# Patient Record
Sex: Female | Born: 1980 | Race: White | Hispanic: No | Marital: Single | Smoking: Former smoker
Health system: Southern US, Community
[De-identification: ages and names within clinical notes are randomized; demographics above are authoritative.]

## PROBLEM LIST (undated history)

## (undated) DIAGNOSIS — F419 Anxiety disorder, unspecified: Secondary | ICD-10-CM

## (undated) DIAGNOSIS — F32A Depression, unspecified: Secondary | ICD-10-CM

## (undated) DIAGNOSIS — E0500 Thyrotoxicosis with diffuse goiter without thyrotoxic crisis or storm: Secondary | ICD-10-CM

## (undated) DIAGNOSIS — F4310 Post-traumatic stress disorder, unspecified: Secondary | ICD-10-CM

## (undated) DIAGNOSIS — Z8679 Personal history of other diseases of the circulatory system: Secondary | ICD-10-CM

## (undated) DIAGNOSIS — E0590 Thyrotoxicosis, unspecified without thyrotoxic crisis or storm: Secondary | ICD-10-CM

## (undated) HISTORY — DX: Depression, unspecified: F32.A

## (undated) HISTORY — DX: Post-traumatic stress disorder, unspecified: F43.10

## (undated) HISTORY — DX: Thyrotoxicosis with diffuse goiter without thyrotoxic crisis or storm: E05.00

## (undated) HISTORY — DX: Thyrotoxicosis, unspecified without thyrotoxic crisis or storm: E05.90

## (undated) HISTORY — PX: NASAL SINUS SURGERY: SHX719

## (undated) HISTORY — DX: Anxiety disorder, unspecified: F41.9

## (undated) HISTORY — PX: TONSILLECTOMY: SUR1361

## (undated) HISTORY — DX: Personal history of other diseases of the circulatory system: Z86.79

---

## 2019-11-07 ENCOUNTER — Emergency Department (HOSPITAL_BASED_OUTPATIENT_CLINIC_OR_DEPARTMENT_OTHER): Payer: BC Managed Care – PPO

## 2019-11-07 ENCOUNTER — Encounter (HOSPITAL_BASED_OUTPATIENT_CLINIC_OR_DEPARTMENT_OTHER): Payer: Self-pay | Admitting: Emergency Medicine

## 2019-11-07 ENCOUNTER — Telehealth (HOSPITAL_BASED_OUTPATIENT_CLINIC_OR_DEPARTMENT_OTHER): Payer: Self-pay | Admitting: Emergency Medicine

## 2019-11-07 ENCOUNTER — Emergency Department (HOSPITAL_BASED_OUTPATIENT_CLINIC_OR_DEPARTMENT_OTHER)
Admission: EM | Admit: 2019-11-07 | Discharge: 2019-11-07 | Disposition: A | Discharge status: Home / Self Care | Payer: BC Managed Care – PPO | Attending: Emergency Medicine | Admitting: Emergency Medicine

## 2019-11-07 ENCOUNTER — Other Ambulatory Visit: Payer: Self-pay

## 2019-11-07 DIAGNOSIS — N898 Other specified noninflammatory disorders of vagina: Secondary | ICD-10-CM | POA: Diagnosis not present

## 2019-11-07 DIAGNOSIS — R202 Paresthesia of skin: Secondary | ICD-10-CM | POA: Diagnosis not present

## 2019-11-07 DIAGNOSIS — R42 Dizziness and giddiness: Secondary | ICD-10-CM | POA: Diagnosis not present

## 2019-11-07 DIAGNOSIS — R002 Palpitations: Secondary | ICD-10-CM | POA: Diagnosis present

## 2019-11-07 DIAGNOSIS — Z20822 Contact with and (suspected) exposure to covid-19: Secondary | ICD-10-CM | POA: Insufficient documentation

## 2019-11-07 DIAGNOSIS — E039 Hypothyroidism, unspecified: Secondary | ICD-10-CM | POA: Diagnosis not present

## 2019-11-07 LAB — URINALYSIS, MICROSCOPIC (REFLEX)

## 2019-11-07 LAB — CBC WITH DIFFERENTIAL/PLATELET
Abs Immature Granulocytes: 0.07 10*3/uL (ref 0.00–0.07)
Basophils Absolute: 0.1 10*3/uL (ref 0.0–0.1)
Basophils Relative: 0 %
Eosinophils Absolute: 0 10*3/uL (ref 0.0–0.5)
Eosinophils Relative: 0 %
HCT: 42.4 % (ref 36.0–46.0)
Hemoglobin: 13.7 g/dL (ref 12.0–15.0)
Immature Granulocytes: 1 %
Lymphocytes Relative: 10 %
Lymphs Abs: 1.6 10*3/uL (ref 0.7–4.0)
MCH: 24.8 pg — ABNORMAL LOW (ref 26.0–34.0)
MCHC: 32.3 g/dL (ref 30.0–36.0)
MCV: 76.7 fL — ABNORMAL LOW (ref 80.0–100.0)
Monocytes Absolute: 0.7 10*3/uL (ref 0.1–1.0)
Monocytes Relative: 5 %
Neutro Abs: 13.1 10*3/uL — ABNORMAL HIGH (ref 1.7–7.7)
Neutrophils Relative %: 84 %
Platelets: 501 10*3/uL — ABNORMAL HIGH (ref 150–400)
RBC: 5.53 MIL/uL — ABNORMAL HIGH (ref 3.87–5.11)
RDW: 13.7 % (ref 11.5–15.5)
WBC: 15.5 10*3/uL — ABNORMAL HIGH (ref 4.0–10.5)
nRBC: 0 % (ref 0.0–0.2)

## 2019-11-07 LAB — COMPREHENSIVE METABOLIC PANEL
ALT: 15 U/L (ref 0–44)
AST: 24 U/L (ref 15–41)
Albumin: 4.8 g/dL (ref 3.5–5.0)
Alkaline Phosphatase: 94 U/L (ref 38–126)
Anion gap: 13 (ref 5–15)
BUN: 9 mg/dL (ref 6–20)
CO2: 21 mmol/L — ABNORMAL LOW (ref 22–32)
Calcium: 9.3 mg/dL (ref 8.9–10.3)
Chloride: 103 mmol/L (ref 98–111)
Creatinine, Ser: 0.76 mg/dL (ref 0.44–1.00)
GFR calc Af Amer: >60 mL/min (ref >60)
GFR calc non Af Amer: >60 mL/min (ref >60)
Glucose, Bld: 153 mg/dL — ABNORMAL HIGH (ref 70–99)
Potassium: 3.4 mmol/L — ABNORMAL LOW (ref 3.5–5.1)
Sodium: 137 mmol/L (ref 135–145)
Total Bilirubin: 0.4 mg/dL (ref 0.3–1.2)
Total Protein: 8 g/dL (ref 6.5–8.1)

## 2019-11-07 LAB — WET PREP, GENITAL
Clue Cells Wet Prep HPF POC: NONE SEEN
Sperm: NONE SEEN
Trich, Wet Prep: NONE SEEN
Yeast Wet Prep HPF POC: NONE SEEN

## 2019-11-07 LAB — TSH: TSH: <0.01 u[IU]/mL — ABNORMAL LOW (ref 0.350–4.500)

## 2019-11-07 LAB — URINALYSIS, ROUTINE W REFLEX MICROSCOPIC
Bilirubin Urine: NEGATIVE
Glucose, UA: 250 mg/dL — AB
Ketones, ur: NEGATIVE mg/dL
Leukocytes,Ua: NEGATIVE
Nitrite: NEGATIVE
Protein, ur: NEGATIVE mg/dL
Specific Gravity, Urine: 1.025 (ref 1.005–1.030)
pH: 6 (ref 5.0–8.0)

## 2019-11-07 LAB — MAGNESIUM: Magnesium: 1.9 mg/dL (ref 1.7–2.4)

## 2019-11-07 LAB — PREGNANCY, URINE: Preg Test, Ur: NEGATIVE

## 2019-11-07 LAB — SARS CORONAVIRUS 2 BY RT PCR (HOSPITAL ORDER, PERFORMED IN ~~LOC~~ HOSPITAL LAB): SARS Coronavirus 2: NEGATIVE

## 2019-11-07 MED ORDER — LORAZEPAM 1 MG PO TABS
1.0000 mg | ORAL_TABLET | Freq: Once | ORAL | Status: AC
  Administered 2019-11-07: 1 mg via ORAL
  Filled 2019-11-07: qty 1

## 2019-11-07 MED ORDER — SODIUM CHLORIDE 0.9 % IV BOLUS
1000.0000 mL | Freq: Once | INTRAVENOUS | Status: AC
  Administered 2019-11-07: 1000 mL via INTRAVENOUS

## 2019-11-07 NOTE — ED Provider Notes (Signed)
[facility] EMERGENCY DEPARTMENT Provider Note   CSN: [id number] Arrival date & time: 11/07/19  0740     History Chief Complaint  Patient presents with  . Spasms  . Panic Attack  . Bulging Eyes  . Night Sweats    Ruth Taylor is a 39 y.o. female.  She has a history of hyperthyroidism and heart failure.   She said she is under a lot of stress being a single mom and working full-time.  Started with palpitations which she described as fast ,last evening.  Associated with some cramping in her legs and some tingling.  Had trouble sleeping.  Felt panicked.  Had some axillary sweating.  She also said she had her period Last week and forgot a tampon in and just took it out a couple of days ago.  Concerned she might have toxic shock syndrome.  Some green discharge.  No chest pain no abdominal pain.  No dysuria.  No fevers or chills. Chest feels tight with deep breath. No leg swelling.   The history is provided by the patient.  Palpitations Palpitations quality:  Fast Onset quality:  Sudden Timing:  Intermittent Progression:  Unchanged Chronicity:  Recurrent Context: anxiety   Relieved by:  Nothing Worsened by:  Nothing Ineffective treatments:  Breathing exercises Associated symptoms: dizziness and numbness   Associated symptoms: no back pain, no chest pain, no cough, no hemoptysis, no lower extremity edema, no nausea and no vomiting   Risk factors: hx of thyroid disease and stress        History reviewed. No pertinent past medical history.  There are no problems to display for this patient.   History reviewed. No pertinent surgical history.   OB History   No obstetric history on file.     History reviewed. No pertinent family history.  Social History   Tobacco Use  . Smoking status: Not on file  Substance Use Topics  . Alcohol use: Not on file  . Drug use: Not on file    Home Medications Prior to Admission medications   Not on File    Allergies      Sulfa antibiotics  Review of Systems   Review of Systems  Constitutional: Negative for fever.  HENT: Negative for sore throat.   Eyes: Negative for visual disturbance.  Respiratory: Negative for cough and hemoptysis.   Cardiovascular: Positive for palpitations. Negative for chest pain.  Gastrointestinal: Negative for nausea and vomiting.  Genitourinary: Positive for vaginal discharge.  Musculoskeletal: Negative for back pain.  Skin: Negative for rash.  Neurological: Positive for dizziness, light-headedness and numbness.    Physical Exam Updated Vital Signs BP (!) 147/86   Pulse 80   Temp 98.8 F (37.1 C) (Oral)   Resp 20   SpO2 100%   Physical Exam Vitals and nursing note reviewed. Exam conducted with a chaperone present.  Constitutional:      General: She is not in acute distress.    Appearance: Normal appearance. She is well-developed.  HENT:     Head: Normocephalic and atraumatic.  Eyes:     Conjunctiva/sclera: Conjunctivae normal.  Cardiovascular:     Rate and Rhythm: Normal rate and regular rhythm.     Heart sounds: No murmur heard.   Pulmonary:     Effort: Pulmonary effort is normal. No respiratory distress.     Breath sounds: Normal breath sounds.  Abdominal:     Palpations: Abdomen is soft.     Tenderness: There is no abdominal  tenderness.  Genitourinary:    General: Normal vulva.     Vagina: Vaginal discharge (small tan) present.     Cervix: No cervical motion tenderness, discharge, erythema or cervical bleeding.     Uterus: Normal.      Adnexa: Right adnexa normal and left adnexa normal.     Comments: Nurse Asencion Islam as chaperone Musculoskeletal:        General: No deformity or signs of injury. Normal range of motion.     Cervical back: Neck supple.  Skin:    General: Skin is warm and dry.     Capillary Refill: Capillary refill takes less than 2 seconds.  Neurological:     General: No focal deficit present.     Mental Status: She is alert.      Cranial Nerves: No cranial nerve deficit.     Sensory: No sensory deficit.     Motor: No weakness.     ED Results / Procedures / Treatments   Labs (all labs ordered are listed, but only abnormal results are displayed) Labs Reviewed  WET PREP, GENITAL - Abnormal; Notable for the following components:      Result Value   WBC, Wet Prep HPF POC MANY (*)    All other components within normal limits  COMPREHENSIVE METABOLIC PANEL - Abnormal; Notable for the following components:   Potassium 3.4 (*)    CO2 21 (*)    Glucose, Bld 153 (*)    All other components within normal limits  CBC WITH DIFFERENTIAL/PLATELET - Abnormal; Notable for the following components:   WBC 15.5 (*)    RBC 5.53 (*)    MCV 76.7 (*)    MCH 24.8 (*)    Platelets 501 (*)    Neutro Abs 13.1 (*)    All other components within normal limits  TSH - Abnormal; Notable for the following components:   TSH <0.010 (*)    All other components within normal limits  URINALYSIS, ROUTINE W REFLEX MICROSCOPIC - Abnormal; Notable for the following components:   Glucose, UA 250 (*)    Hgb urine dipstick LARGE (*)    All other components within normal limits  URINALYSIS, MICROSCOPIC (REFLEX) - Abnormal; Notable for the following components:   Bacteria, UA FEW (*)    All other components within normal limits  SARS CORONAVIRUS 2 BY RT PCR (HOSPITAL ORDER, PERFORMED IN [place] [facility])  PREGNANCY, URINE  MAGNESIUM  GC/CHLAMYDIA PROBE AMP ([place]) NOT AT [facility]    EKG EKG Interpretation  Date/Time:  Wednesday November 07 2019 08:44:21 EDT Ventricular Rate:  90 PR Interval:    QRS Duration: 87 QT Interval:  359 QTC Calculation: 440 R Axis:   77 Text Interpretation: Sinus rhythm Short PR interval Consider right atrial enlargement Borderline ST depression, diffuse leads No old tracing to compare Confirmed by Meridee Score [phone]) on 11/07/2019 8:47:24 AM Also confirmed by Meridee Score [phone]), editor  Elita Quick ([id number])  on 11/07/2019 10:30:22 AM   Radiology CT Head Wo Contrast  Result Date: 11/07/2019 CLINICAL DATA:  Dizziness. EXAM: CT HEAD WITHOUT CONTRAST TECHNIQUE: Contiguous axial images were obtained from the base of the skull through the vertex without intravenous contrast. COMPARISON:  None FINDINGS: Brain: No evidence of acute infarction, hemorrhage, hydrocephalus, extra-axial collection or mass lesion/mass effect. Vascular: No hyperdense vessel or unexpected calcification. Skull: Normal. Negative for fracture or focal lesion. Sinuses/Orbits: Post surgical changes from bilateral median antrectomy. Proptosis noted bilaterally. Other: None. IMPRESSION: 1. No  acute intracranial abnormalities. Normal brain. 2. Bilateral proptosis. Electronically Signed   By: Signa Kell M.D.   On: 11/07/2019 13:25   DG Chest Port 1 View  Result Date: 11/07/2019 CLINICAL DATA:  Weakness EXAM: PORTABLE CHEST 1 VIEW COMPARISON:  None. FINDINGS: The heart size and mediastinal contours are within normal limits. Both lungs are clear. The visualized skeletal structures are unremarkable. IMPRESSION: No active disease. Electronically Signed   By: Alcide Clever M.D.   On: 11/07/2019 13:10    Procedures Procedures (including critical care time)  Medications Ordered in ED Medications  sodium chloride 0.9 % bolus 1,000 mL (0 mLs Intravenous Stopped 11/07/19 1031)  LORazepam (ATIVAN) tablet 1 mg (1 mg Oral Given 11/07/19 1149)    ED Course  I have reviewed the triage vital signs and the nursing notes.  Pertinent labs & imaging results that were available during my care of the patient were reviewed by me and considered in my medical decision making (see chart for details).  Clinical Course as of Nov 06 1724  Wed Nov 07, 2019  1055 Patient ambulated in the department and pulse ox remained 100%.  Heart rate did go up to 120.  I spoke with the patient at length.  Recommended going home and taking a few  days off of work.  Staying well-hydrated.  Following up with her primary care doctor.  I also let her know that her TSH and GC chlamydia were still pending at time of discharge.  Return instructions discussed.   [MB]  1139 I put the patient for discharge but the nurse informed me that she does not feel like she can leave.  Still feels fuzzy in her head and is tingling all over.  Concerned that her blood pressure was high and she may be going into heart failure again.  Have put her in for chest x-ray head CT and ordered some oral Ativan.   [MB]  1307 Chest x-ray interpreted by me as no infiltrates normal heart size no evidence of failure.   [MB]  1354 Reviewed patient's results with her again.  She said she feels better but not completely back to normal.  Recommended that she contact her primary care doctor for close follow-up.  We will also put a referral in for neurology.   [MB]    Clinical Course User Index [MB] Terrilee Files, MD   MDM Rules/Calculators/A&P                         This patient complains of rapid heart rate tingling in her hands and feet spreading to her entire body, whole body foggy feeling, tightness in her chest; this involves an extensive number of treatment Options and is a complaint that carries with it a high risk of complications and Morbidity. The differential includes metabolic derangement, hyperthyroidism, hypothyroidism, infection, Covid, anemia, arrhythmia, pneumonia  I ordered, reviewed and interpreted labs, which included CBC with normal hemoglobin, elevated white count, elevated platelets, chemistries with elevated glucose mildly low bicarb mildly low potassium, normal LFTs, pregnancy test negative, urinalysis without overt signs of infection, normal magnesium.  Thyroid testing pending at time of discharge I ordered medication IV fluids and oral lorazepam I ordered imaging studies which included chest x-ray and head CT and I independently    visualized and  interpreted imaging which showed no acute findings  Previous records obtained and reviewed in epic, no recent admissions  After the interventions stated above, I  reevaluated the patient and found patient still to be feeling somewhat anxious and tingly.  Blood pressure mildly elevated but heart rate and temperature not.  Could still be related to thyroid although not showing signs of true hyper metabolic syndrome.  Recommended close follow-up with PCP.  Return instructions discussed.  535pm - A few hours after the patient was discharged I was completing my note and saw that patient's TSH had come back essentially undetectable.  I called back to the department and asked that the charge nurse call the patient and relay this to her and emphasize close follow-up with her primary care doctor and endocrinologist and to continue taking her hypothyroid medications.  Also encouraged her to return to the department if any worsening symptoms.   Final Clinical Impression(s) / ED Diagnoses Final diagnoses:  Palpitations  Paresthesias    Rx / DC Orders ED Discharge Orders    None       Terrilee Files, MD 11/07/19 1739

## 2019-11-07 NOTE — ED Notes (Signed)
Pt was dc'd , went back to see if room was empty, pt was still on stretcher,  Had not made a move to get dressed,  Stated she didn't feel right, clammy, and having palpations  md notified and will go back to see pt

## 2019-11-07 NOTE — Discharge Instructions (Addendum)
You were seen in the emergency department for multiple complaints including palpitations with a fast heart rate, numbness and tingling in your hands and feet, muscle spasms, tightness in your chest.  You had blood work EKG, urinalysis and a pelvic exam that did not show any clear explanation for your symptoms.  It will be important for you to schedule a follow-up appointment with your doctor and get a referral to endocrinology.  Your thyroid testing and gonorrhea chlamydia were pending at time of discharge.  You can follow-up on MyChart to see these results.  Please return to the emergency department for any worsening or concerning symptoms.

## 2019-11-07 NOTE — ED Triage Notes (Signed)
Pt presented with anxiety, but on further questioning, has muscle spasms, nights sweats and bulging eyes. Hx of Graves. Under immense stress.

## 2019-11-08 LAB — GC/CHLAMYDIA PROBE AMP (~~LOC~~) NOT AT ARMC
Chlamydia: NEGATIVE
Comment: NEGATIVE
Comment: NORMAL
Neisseria Gonorrhea: NEGATIVE

## 2019-11-12 ENCOUNTER — Ambulatory Visit: Payer: BC Managed Care – PPO | Admitting: Neurology

## 2019-11-12 ENCOUNTER — Encounter: Payer: Self-pay | Admitting: Neurology

## 2019-11-12 ENCOUNTER — Other Ambulatory Visit: Payer: Self-pay

## 2019-11-12 VITALS — Ht 63.0 in | Wt 136.0 lb

## 2019-11-12 DIAGNOSIS — R42 Dizziness and giddiness: Secondary | ICD-10-CM | POA: Diagnosis not present

## 2019-11-12 DIAGNOSIS — R29818 Other symptoms and signs involving the nervous system: Secondary | ICD-10-CM

## 2019-11-12 DIAGNOSIS — R202 Paresthesia of skin: Secondary | ICD-10-CM

## 2019-11-12 DIAGNOSIS — E0590 Thyrotoxicosis, unspecified without thyrotoxic crisis or storm: Secondary | ICD-10-CM

## 2019-11-12 DIAGNOSIS — R29898 Other symptoms and signs involving the musculoskeletal system: Secondary | ICD-10-CM

## 2019-11-12 DIAGNOSIS — G1229 Other motor neuron disease: Secondary | ICD-10-CM

## 2019-11-12 DIAGNOSIS — G1220 Motor neuron disease, unspecified: Secondary | ICD-10-CM

## 2019-11-12 DIAGNOSIS — R7309 Other abnormal glucose: Secondary | ICD-10-CM

## 2019-11-12 DIAGNOSIS — R258 Other abnormal involuntary movements: Secondary | ICD-10-CM

## 2019-11-12 DIAGNOSIS — R251 Tremor, unspecified: Secondary | ICD-10-CM

## 2019-11-12 DIAGNOSIS — R531 Weakness: Secondary | ICD-10-CM

## 2019-11-12 DIAGNOSIS — R292 Abnormal reflex: Secondary | ICD-10-CM

## 2019-11-12 DIAGNOSIS — R200 Anesthesia of skin: Secondary | ICD-10-CM

## 2019-11-12 DIAGNOSIS — H53133 Sudden visual loss, bilateral: Secondary | ICD-10-CM

## 2019-11-12 NOTE — Progress Notes (Signed)
[facility]    Provider:  Dr Lucia Gaskins Requesting Provider: Terrilee Files, MD (ED) Primary Care Provider:  Armando Gang, FNP  CC:  Dizziness and multiple neurologic symptoms  HPI:  Ruth Taylor is a 39 y.o. female here as requested by Terrilee Files, MD(ED) for dizziness follow up from ED. PMHx PTSD, hyperthyroid graves disease, depression, anxiety.  I reviewed emergency room notes, she has a history of hypothyroidism and heart failure, she is under a lot of stress being a single mom and working full-time, started with palpitations, associated with some cramping in her legs and some tingling, felt panicked, also said she had her.,  No chest pain.  Started with palpitations per patient, lasted all night into the next day, thought it was a panic attack, she had cramps, she started feeling clammy and disoriented, she felt like she was "on something", dizziness happens with position changes and standing. She has been under stress. She had tingling in her legs, she was a little panicked, CT of the head was normal, her arms started going numb in her hands, feeling "out of it", she was shaking at work, her whole body went numb from the neck down, felt like she couldn't move, she had electrodes through her body to the fingertips bilaterally and symmetrical like an electric shock, blood pressure was elevated, she couldn't walk. Losing vision. Hallucinating. She is fatigued. She has been under a lot of stress. She saw her pcp this week, she had blurred vision. Continues to have episodes. If she is in the bed she is fine its when she stands the symptoms worse, better laying, worse with stress. But she continues to have this, sensory level at the neck, face numbness is not involved. She had an episode of room spinning. She has had heart failure. No other focal neurologic deficits, associated symptoms, inciting events or modifiable factors.  Reviewed notes, labs and imaging from outside  physicians, which showed:    CT head 11/07/2019 showed No acute intracranial abnormalities including mass lesion or mass effect, hydrocephalus, extra-axial fluid collection, midline shift, hemorrhage, or acute infarction, large ischemic events (personally reviewed images).  Bilateral proptosis consistent with her history of thyroid disease.  Reviewed labs November 07, 2019: Magnesium normal, CMP with elevated glucose, slightly lower potassium 3.4, CO2 21, BUN 9, creatinine 0.76 otherwise normal; CBC with elevated white blood cells, decreased MCV, elevated platelets 501, elevated neutrophil otherwise normal, TSH significantly reduced less than 0.010, urinalysis with large glucose   Review of Systems: Patient complains of symptoms per HPI as well as the following symptoms: paplitations, tremors, numbness, anxiety. Pertinent negatives and positives per HPI. All others negative.   Social History   Socioeconomic History  . Marital status: Single    Spouse name: Not on file  . Number of children: 2  . Years of education: Not on file  . Highest education level: Not on file  Occupational History  . Not on file  Tobacco Use  . Smoking status: Former Smoker    Types: Cigarettes  . Smokeless tobacco: Never Used  Vaping Use  . Vaping Use: Never used  Substance and Sexual Activity  . Alcohol use: Not Currently  . Drug use: Not Currently    Comment: marijuana in past  . Sexual activity: Not on file    Comment: will be starting [facility] [employer] soon  Other Topics Concern  . Not on file  Social History Narrative   Lives alone, children are there sometimes  Right handed   Caffeine: 1-2 cups/day   Social Determinants of Health   Financial Resource Strain:   . Difficulty of Paying Living Expenses: Not on file  Food Insecurity:   . Worried About Programme researcher, broadcasting/film/videounning Out of Food in the Last Year: Not on file  . Ran Out of Food in the Last Year: Not on file  Transportation Needs:   . Lack of Transportation  (Medical): Not on file  . Lack of Transportation (Non-Medical): Not on file  Physical Activity:   . Days of Exercise per Week: Not on file  . Minutes of Exercise per Session: Not on file  Stress:   . Feeling of Stress : Not on file  Social Connections:   . Frequency of Communication with Friends and Family: Not on file  . Frequency of Social Gatherings with Friends and Family: Not on file  . Attends Religious Services: Not on file  . Active Member of Clubs or Organizations: Not on file  . Attends BankerClub or Organization Meetings: Not on file  . Marital Status: Not on file  Intimate Partner Violence:   . Fear of Current or Ex-Partner: Not on file  . Emotionally Abused: Not on file  . Physically Abused: Not on file  . Sexually Abused: Not on file    Family History  Problem Relation Age of Onset  . Stroke Father        quadriplegic, brain stem stroke  . Heart Problems Paternal Grandmother   . Heart Problems Other        mother's side  . Thyroid disease Other        mother's side   . Stroke Other        mother's side   . Cancer Other        mother's side   . Heart Problems Paternal Uncle   . Neuromuscular disorder Neg Hx     Past Medical History:  Diagnosis Date  . Anxiety   . Depression   . Graves disease   . Hx of heart failure    "associated with autoimmune"  . Hyperthyroidism   . PTSD (post-traumatic stress disorder)     Patient Active Problem List   Diagnosis Date Noted  . Hyperthyroidism 11/12/2019    Past Surgical History:  Procedure Laterality Date  . NASAL SINUS SURGERY    . TONSILLECTOMY      Current Outpatient Medications  Medication Sig Dispense Refill  . clonazePAM (KLONOPIN) 0.5 MG tablet Take 0.5 mg by mouth 2 (two) times daily as needed.    . METHIMAZOLE PO Take 1 each by mouth in the morning, at noon, and at bedtime.      No current facility-administered medications for this visit.    Allergies as of 11/12/2019 - Review Complete 11/12/2019   Allergen Reaction Noted  . Sulfa antibiotics Rash 06/02/2012    Vitals: Ht 5\' 3"  (1.6 m)   Wt 136 lb (61.7 kg)   LMP 11/05/2019 (Approximate)   BMI 24.09 kg/m  Last Weight:  Wt Readings from Last 1 Encounters:  11/12/19 136 lb (61.7 kg)   Last Height:   Ht Readings from Last 1 Encounters:  11/12/19 5\' 3"  (1.6 m)     Physical exam: Exam: Gen: NAD, slightly anxious, conversant, well nourised, well groomed                     CV: RRR, no MRG. No Carotid Bruits. No peripheral edema, warm, nontender Eyes: Conjunctivae  clear without exudates or hemorrhage  Neuro: Detailed Neurologic Exam  Speech:    Speech is normal; fluent and spontaneous with normal comprehension.  Cognition:    The patient is oriented to person, place, and time;     recent and remote memory intact;     language fluent;     normal attention, concentration,     fund of knowledge Cranial Nerves:    The pupils are equal, round, and reactive to light. The fundi are flat, proprtosis. Visual fields are full to finger confrontation. Extraocular movements are intact. Trigeminal sensation is intact and the muscles of mastication are normal. The face is symmetric. The palate elevates in the midline. Hearing intact. Voice is normal. Shoulder shrug is normal. The tongue has normal motion without fasciculations.   Coordination:    Normal finger to nose and heel to shin.  Gait:    Heel-toe and tandem gait are normal but tremors and subjective leg weakness when standing too long  Motor Observation:    No asymmetry, no atrophy, and postural tremor Tone:    Normal muscle tone.    Posture:    Posture is normal. normal erect    Strength:    Strength is V/V in the upper and lower limbs with some proximal LE giveaway      Sensation: intact to LT     Reflex Exam:  DTR's:    Deep tendon reflexes in the upper and lower extremities are brisk bilaterally.   Toes:    The toes are downgoing bilaterally.   Clonus:    2 beats AJs    Assessment/Plan: Lovely 39 year old here with a constellation of symptoms as ab and belowove. Patient needs follow-up with primary care asap(she is going today), At ED large glucose in her urinalysis, elevated serum glucose, TSH hyperthyroid(<0.010), palpitations, prior heart failure, slightly orthostatic on exam today ie blood pressure variations, she needs evaluation with primary care especially for diabetes(suggest hgba1c), treatment of hyperthyroidism, evaluation of cardiac problems, there is a wide differential and pcp needs to thoroughly evaluate which I am sure she is planning today.  We will check MRI brain and cervical spine given concerning symptoms of numbness below the neck, inability to walk, severe episodic weakness, episodes of vision loss (likely due to BP variations because very positional however needs a thorough neurologic exam). Needs eval for MS,lesions of the internal auditory canal, lesions of the spinal cord such as transverse myelitis etc to ensure no primary neurologic etiology but is going to see Franco Nones today and I will send pcp this note prior. I think a primary neurologic disorder is less likely but will evaluate for other - symptoms of clonus and brisk reflexes at AJs may be normal (up to 2 beats may be normal ) or due to hyperthyroidism but need to evaluate for upper motor neuron lesions  Laying 109/63, Sitting 105/70, standing 125/73, standing 3 minutes 108/77.  Orders Placed This Encounter  Procedures  . MR BRAIN W WO CONTRAST  . MR CERVICAL SPINE W WO CONTRAST   No orders of the defined types were placed in this encounter.   Cc:  Armando Gang, FNP  Naomie Dean, MD  [facility] [address] [place], [place] [address]  Phone [phone] Fax [phone]   I spent 85 minutes of face-to-face and non-face-to-face time with patient on the  1. Limb weakness   2. Elevated glucose   3. Dizziness     4.  Vertigo   5. Weakness   6. Numbness and tingling of both lower extremities   7. Numbness and tingling of both upper extremities   8. Tremor   9. Sudden visual loss of both eyes   10. Lhermitte's sign positive   11. Clonus   12. Brisk deep tendon reflexes   13. 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 Hyperthyroidism    diagnosis.  This included previsit chart review, lab review, study review, order entry, electronic health record documentation, patient education on the different diagnostic and therapeutic options, counseling and coordination of care, risks and benefits of management, compliance, or risk factor reduction

## 2019-11-12 NOTE — Patient Instructions (Addendum)
Mention HGbA1c (your serum and urine glucose were elevated)   Lovely 39 year old here with a constellation of symptoms as ab and belowove. Patient needs follow-up with primary care asap(she is going today), At ED large glucose in her urinalysis, elevated serum glucose, TSH hyperthyroid(<0.010), palpitations, prior heart failure, slightly orthostatic on exam today ie blood pressure variations, she needs evaluation with primary care especially for diabetes(suggest hgba1c), treatment of hyperthyroidism, evaluation of cardiac problems, there is a wide differential and pcp needs to thoroughly evaluate which I am sure she is planning today.  We will check MRI brain and cervical spine given concerning symptoms of numbness below the neck, inability to walk, severe episodic weakness, episodes of vision loss (likely due to BP variations because very positional however needs a thorough neurologic exam). Needs eval for MS,lesions of the internal auditory canal, lesions of the spinal cord such as transverse myelitis etc to ensure no primary neurologic etiology but is going to see Franco Nones today and I will send pcp this note prior. I think a primary neurologic disorder is less likely but will evaluate for other - symptoms of clonus and brisk reflexes at AJs may be normal (up to 2 beats may be normal ) or due to hyperthyroidism but need to evaluate for upper motor neuron lesions

## 2019-11-13 ENCOUNTER — Telehealth: Payer: Self-pay | Admitting: Neurology

## 2019-11-13 NOTE — Telephone Encounter (Signed)
spoke to the patient due to the cost she states she is going to hold off  [employer] Auth: [id number] (exp. 11/13/19 to 05/10/20)

## 2020-04-29 ENCOUNTER — Telehealth: Payer: Self-pay

## 2020-04-29 NOTE — Telephone Encounter (Deleted)
[facility] Night - Client Nonclinical Telephone Record  [facility] Client [facility] Night - Client Client Site  [facility] - Night Contact Type Call Who Is Calling Patient / Member / Family / Caregiver Caller Name Alexzandra Bilton Caller Phone Number [phone] Call Type Message Only Information Provided Reason for Call Returning a Call from the Office Initial Comment caller is returning a missed call Disp. Time Disposition Final User 04/28/2020 5:11:26 PM General Information Provided Yes Chance, Haley Call Closed By: Gershon Crane Transaction Date/Time: 04/28/2020 5:08:30 PM (ET)

## 2020-04-29 NOTE — Telephone Encounter (Signed)
[facility] Night - Client Nonclinical Telephone Record  [facility] Client [facility] Night - Client Client Site [facility] - Night Contact Type Call Who Is Calling Patient / Member / Family / Caregiver Caller Name Ruth Taylor Caller Phone Number [phone] Call Type Message Only Information Provided Reason for Call Returning a Call from the Office Initial Comment caller is returning a missed call Disp. Time Disposition Final User 04/28/2020 5:11:26 PM General Information Provided Yes Chance, Haley Call Closed By: Haley Chance Transaction Date/Time: 04/28/2020 5:08:30 PM (ET) 

## 2020-04-29 NOTE — Telephone Encounter (Signed)
Unable to route note to another provider in pt's chart. Says recipient not valid.   Contacted Laurin who reports she was not calling for herself. She was calling for her grandmother and she has already talked to someone in the office.

## 2020-08-25 ENCOUNTER — Other Ambulatory Visit: Payer: Self-pay

## 2020-08-25 ENCOUNTER — Encounter: Payer: Self-pay | Admitting: Emergency Medicine

## 2020-08-25 ENCOUNTER — Ambulatory Visit (HOSPITAL_COMMUNITY)
Admission: EM | Admit: 2020-08-25 | Discharge: 2020-08-25 | Disposition: A | Discharge status: Home / Self Care | Payer: BC Managed Care – PPO | Attending: Urgent Care | Admitting: Urgent Care

## 2020-08-25 ENCOUNTER — Encounter (HOSPITAL_COMMUNITY): Payer: Self-pay | Admitting: *Deleted

## 2020-08-25 DIAGNOSIS — E0500 Thyrotoxicosis with diffuse goiter without thyrotoxic crisis or storm: Secondary | ICD-10-CM | POA: Insufficient documentation

## 2020-08-25 DIAGNOSIS — S8990XA Unspecified injury of unspecified lower leg, initial encounter: Secondary | ICD-10-CM

## 2020-08-25 DIAGNOSIS — M79662 Pain in left lower leg: Secondary | ICD-10-CM

## 2020-08-25 DIAGNOSIS — S8992XA Unspecified injury of left lower leg, initial encounter: Secondary | ICD-10-CM

## 2020-08-25 DIAGNOSIS — M79605 Pain in left leg: Secondary | ICD-10-CM

## 2020-08-25 HISTORY — DX: Thyrotoxicosis with diffuse goiter without thyrotoxic crisis or storm: E05.00

## 2020-08-25 MED ORDER — NAPROXEN 500 MG PO TABS: 500.0000 mg | ORAL_TABLET | Freq: Two times a day (BID) | ORAL | 0 refills | Status: DC

## 2020-08-25 NOTE — ED Triage Notes (Signed)
PT reports jumping off a 32ft ladder and had a popin LT calf.PT unable bear weight on lt leg

## 2020-08-25 NOTE — ED Provider Notes (Signed)
[facility]   MRN: [id number] DOB: 1980-07-07  Subjective:   Ruth Taylor is a 40 y.o. female presenting for suffering a left calf injury today.  Patient states that she jumped off of a 2 step stool and when she landed on her feet heard a loud pop with severe pain of her left calf.  Feels like she cannot bear weight at all on that left leg.  Has felt some swelling but her primary concern is pop knee she injured her calf.  No current facility-administered medications for this encounter.  Current Outpatient Medications:    clonazePAM (KLONOPIN) 0.5 MG tablet, Take 0.5 mg by mouth 2 (two) times daily as needed., Disp: , Rfl:    METHIMAZOLE PO, Take 1 each by mouth in the morning, at noon, and at bedtime. , Disp: , Rfl:    Allergies  Allergen Reactions   Sulfa Antibiotics Rash    Past Medical History:  Diagnosis Date   Anxiety    Depression    Graves disease    Graves' disease    Hx of heart failure    "associated with autoimmune"   Hyperthyroidism    PTSD (post-traumatic stress disorder)      Past Surgical History:  Procedure Laterality Date   NASAL SINUS SURGERY     TONSILLECTOMY      Family History  Problem Relation Age of Onset   Stroke Father        quadriplegic, brain stem stroke   Heart Problems Paternal Grandmother    Heart Problems Paternal Uncle    Heart Problems Other        mother's side   Thyroid disease Other        mother's side    Stroke Other        mother's side    Cancer Other        mother's side    Neuromuscular disorder Neg Hx     Social History   Tobacco Use   Smoking status: Former    Pack years: 0.00    Types: Cigarettes   Smokeless tobacco: Never  Vaping Use   Vaping Use: Never used  Substance Use Topics   Alcohol use: Not Currently   Drug use: Not Currently    Comment: marijuana in past    ROS   Objective:   Vitals: BP 119/72   Pulse 70   Temp 98.3 F (36.8 C) (Oral)   Resp 18   LMP 08/06/2020  (Approximate)   SpO2 100%   Physical Exam Constitutional:      General: She is not in acute distress.    Appearance: Normal appearance. She is well-developed. She is not ill-appearing, toxic-appearing or diaphoretic.  HENT:     Head: Normocephalic and atraumatic.     Nose: Nose normal.     Mouth/Throat:     Mouth: Mucous membranes are moist.     Pharynx: Oropharynx is clear.  Eyes:     General: No scleral icterus.    Extraocular Movements: Extraocular movements intact.     Pupils: Pupils are equal, round, and reactive to light.  Cardiovascular:     Rate and Rhythm: Normal rate.  Pulmonary:     Effort: Pulmonary effort is normal.  Musculoskeletal:     Left lower leg: Swelling (trace over left) and tenderness (Mid calf) present. No deformity, lacerations or bony tenderness. No edema.     Comments: Near full range of motion for the left lower  extremity, strength 5/5.  Skin:    General: Skin is warm and dry.  Neurological:     General: No focal deficit present.     Mental Status: She is alert and oriented to person, place, and time.     Motor: No weakness.  Psychiatric:        Mood and Affect: Mood normal.        Behavior: Behavior normal.        Thought Content: Thought content normal.        Judgment: Judgment normal.    Assessment and Plan :   PDMP not reviewed this encounter.  1. Injury of calf   2. Left leg pain   3. Pain of left calf     Patient is to be managed with cam walker boot, crutches and weightbearing as tolerated.  Naproxen for pain and inflammation.  Follow-up with orthopedist as soon as possible. Counseled patient on potential for adverse effects with medications prescribed/recommended today, ER and return-to-clinic precautions discussed, patient verbalized understanding.    Wallis Bamberg, [place] 08/25/20 1545

## 2021-05-11 DIAGNOSIS — N762 Acute vulvitis: Secondary | ICD-10-CM | POA: Diagnosis not present

## 2021-05-11 DIAGNOSIS — R300 Dysuria: Secondary | ICD-10-CM | POA: Diagnosis not present

## 2021-05-11 DIAGNOSIS — Z1152 Encounter for screening for COVID-19: Secondary | ICD-10-CM | POA: Diagnosis not present

## 2021-05-11 DIAGNOSIS — M545 Low back pain, unspecified: Secondary | ICD-10-CM | POA: Diagnosis not present

## 2021-05-11 DIAGNOSIS — R519 Headache, unspecified: Secondary | ICD-10-CM | POA: Diagnosis not present

## 2021-05-11 DIAGNOSIS — Z113 Encounter for screening for infections with a predominantly sexual mode of transmission: Secondary | ICD-10-CM | POA: Diagnosis not present

## 2021-05-11 DIAGNOSIS — N760 Acute vaginitis: Secondary | ICD-10-CM | POA: Diagnosis not present

## 2021-05-11 DIAGNOSIS — R110 Nausea: Secondary | ICD-10-CM | POA: Diagnosis not present

## 2021-05-11 DIAGNOSIS — E785 Hyperlipidemia, unspecified: Secondary | ICD-10-CM | POA: Diagnosis not present

## 2021-05-11 DIAGNOSIS — R319 Hematuria, unspecified: Secondary | ICD-10-CM | POA: Diagnosis not present

## 2021-05-28 DIAGNOSIS — E785 Hyperlipidemia, unspecified: Secondary | ICD-10-CM | POA: Diagnosis not present

## 2021-05-28 DIAGNOSIS — R5381 Other malaise: Secondary | ICD-10-CM | POA: Diagnosis not present

## 2021-05-28 DIAGNOSIS — F329 Major depressive disorder, single episode, unspecified: Secondary | ICD-10-CM | POA: Diagnosis not present

## 2021-05-28 DIAGNOSIS — Z79899 Other long term (current) drug therapy: Secondary | ICD-10-CM | POA: Diagnosis not present

## 2021-05-28 DIAGNOSIS — E039 Hypothyroidism, unspecified: Secondary | ICD-10-CM | POA: Diagnosis not present

## 2021-05-28 DIAGNOSIS — D649 Anemia, unspecified: Secondary | ICD-10-CM | POA: Diagnosis not present

## 2021-05-28 DIAGNOSIS — D519 Vitamin B12 deficiency anemia, unspecified: Secondary | ICD-10-CM | POA: Diagnosis not present

## 2021-05-28 DIAGNOSIS — E559 Vitamin D deficiency, unspecified: Secondary | ICD-10-CM | POA: Diagnosis not present

## 2021-05-28 DIAGNOSIS — E7800 Pure hypercholesterolemia, unspecified: Secondary | ICD-10-CM | POA: Diagnosis not present

## 2021-05-28 DIAGNOSIS — F4310 Post-traumatic stress disorder, unspecified: Secondary | ICD-10-CM | POA: Diagnosis not present

## 2021-05-28 DIAGNOSIS — R5383 Other fatigue: Secondary | ICD-10-CM | POA: Diagnosis not present

## 2021-05-28 DIAGNOSIS — F419 Anxiety disorder, unspecified: Secondary | ICD-10-CM | POA: Diagnosis not present

## 2021-06-11 DIAGNOSIS — N390 Urinary tract infection, site not specified: Secondary | ICD-10-CM | POA: Diagnosis not present

## 2021-06-11 DIAGNOSIS — T3695XA Adverse effect of unspecified systemic antibiotic, initial encounter: Secondary | ICD-10-CM | POA: Diagnosis not present

## 2021-06-11 DIAGNOSIS — B379 Candidiasis, unspecified: Secondary | ICD-10-CM | POA: Diagnosis not present

## 2021-07-16 DIAGNOSIS — N898 Other specified noninflammatory disorders of vagina: Secondary | ICD-10-CM | POA: Diagnosis not present

## 2021-07-16 DIAGNOSIS — F419 Anxiety disorder, unspecified: Secondary | ICD-10-CM | POA: Diagnosis not present

## 2021-07-16 DIAGNOSIS — N899 Noninflammatory disorder of vagina, unspecified: Secondary | ICD-10-CM | POA: Diagnosis not present

## 2021-07-16 DIAGNOSIS — E785 Hyperlipidemia, unspecified: Secondary | ICD-10-CM | POA: Diagnosis not present

## 2021-07-16 DIAGNOSIS — N390 Urinary tract infection, site not specified: Secondary | ICD-10-CM | POA: Diagnosis not present

## 2021-07-16 DIAGNOSIS — N762 Acute vulvitis: Secondary | ICD-10-CM | POA: Diagnosis not present

## 2021-07-16 DIAGNOSIS — Z113 Encounter for screening for infections with a predominantly sexual mode of transmission: Secondary | ICD-10-CM | POA: Diagnosis not present

## 2021-07-21 ENCOUNTER — Inpatient Hospital Stay: Payer: BC Managed Care – PPO

## 2021-07-21 ENCOUNTER — Inpatient Hospital Stay: Payer: BC Managed Care – PPO | Attending: Oncology | Admitting: Oncology

## 2021-07-21 ENCOUNTER — Encounter: Payer: Self-pay | Admitting: Oncology

## 2021-07-21 VITALS — BP 117/76 | HR 98 | Temp 97.8°F | Resp 16 | Wt 145.0 lb

## 2021-07-21 DIAGNOSIS — E0500 Thyrotoxicosis with diffuse goiter without thyrotoxic crisis or storm: Secondary | ICD-10-CM | POA: Diagnosis not present

## 2021-07-21 DIAGNOSIS — R5383 Other fatigue: Secondary | ICD-10-CM | POA: Diagnosis not present

## 2021-07-21 DIAGNOSIS — E611 Iron deficiency: Secondary | ICD-10-CM | POA: Diagnosis not present

## 2021-07-21 DIAGNOSIS — Z87891 Personal history of nicotine dependence: Secondary | ICD-10-CM | POA: Diagnosis not present

## 2021-07-21 DIAGNOSIS — Z803 Family history of malignant neoplasm of breast: Secondary | ICD-10-CM | POA: Diagnosis not present

## 2021-07-21 DIAGNOSIS — N920 Excessive and frequent menstruation with regular cycle: Secondary | ICD-10-CM | POA: Diagnosis not present

## 2021-07-21 DIAGNOSIS — E538 Deficiency of other specified B group vitamins: Secondary | ICD-10-CM

## 2021-07-21 DIAGNOSIS — D508 Other iron deficiency anemias: Secondary | ICD-10-CM

## 2021-07-21 DIAGNOSIS — Z8042 Family history of malignant neoplasm of prostate: Secondary | ICD-10-CM | POA: Insufficient documentation

## 2021-07-21 LAB — CBC WITH DIFFERENTIAL/PLATELET
Abs Immature Granulocytes: 0.01 10*3/uL (ref 0.00–0.07)
Basophils Absolute: 0 10*3/uL (ref 0.0–0.1)
Basophils Relative: 0 %
Eosinophils Absolute: 0.1 10*3/uL (ref 0.0–0.5)
Eosinophils Relative: 1 %
HCT: 45 % (ref 36.0–46.0)
Hemoglobin: 14.5 g/dL (ref 12.0–15.0)
Immature Granulocytes: 0 %
Lymphocytes Relative: 27 %
Lymphs Abs: 1.5 10*3/uL (ref 0.7–4.0)
MCH: 25.7 pg — ABNORMAL LOW (ref 26.0–34.0)
MCHC: 32.2 g/dL (ref 30.0–36.0)
MCV: 79.8 fL — ABNORMAL LOW (ref 80.0–100.0)
Monocytes Absolute: 0.4 10*3/uL (ref 0.1–1.0)
Monocytes Relative: 7 %
Neutro Abs: 3.7 10*3/uL (ref 1.7–7.7)
Neutrophils Relative %: 65 %
Platelets: 304 10*3/uL (ref 150–400)
RBC: 5.64 MIL/uL — ABNORMAL HIGH (ref 3.87–5.11)
RDW: 12.6 % (ref 11.5–15.5)
WBC: 5.7 10*3/uL (ref 4.0–10.5)
nRBC: 0 % (ref 0.0–0.2)

## 2021-07-21 LAB — IRON AND TIBC
Iron: 38 ug/dL (ref 28–170)
Saturation Ratios: 8 % — ABNORMAL LOW (ref 10.4–31.8)
TIBC: 452 ug/dL — ABNORMAL HIGH (ref 250–450)
UIBC: 414 ug/dL

## 2021-07-21 LAB — TECHNOLOGIST SMEAR REVIEW: Plt Morphology: ADEQUATE

## 2021-07-21 LAB — COMPREHENSIVE METABOLIC PANEL
ALT: 38 U/L (ref 0–44)
AST: 39 U/L (ref 15–41)
Albumin: 4.2 g/dL (ref 3.5–5.0)
Alkaline Phosphatase: 130 U/L — ABNORMAL HIGH (ref 38–126)
Anion gap: 8 (ref 5–15)
BUN: 10 mg/dL (ref 6–20)
CO2: 27 mmol/L (ref 22–32)
Calcium: 9.1 mg/dL (ref 8.9–10.3)
Chloride: 104 mmol/L (ref 98–111)
Creatinine, Ser: 0.58 mg/dL (ref 0.44–1.00)
GFR, Estimated: >60 mL/min (ref >60)
Glucose, Bld: 104 mg/dL — ABNORMAL HIGH (ref 70–99)
Potassium: 4.7 mmol/L (ref 3.5–5.1)
Sodium: 139 mmol/L (ref 135–145)
Total Bilirubin: 0.4 mg/dL (ref 0.3–1.2)
Total Protein: 7.8 g/dL (ref 6.5–8.1)

## 2021-07-21 LAB — RETIC PANEL
Immature Retic Fract: 4.6 % (ref 2.3–15.9)
RBC.: 5.62 MIL/uL — ABNORMAL HIGH (ref 3.87–5.11)
Retic Count, Absolute: 50 10*3/uL (ref 19.0–186.0)
Retic Ct Pct: 0.9 % (ref 0.4–3.1)
Reticulocyte Hemoglobin: 29.5 pg (ref >27.9)

## 2021-07-21 LAB — FERRITIN: Ferritin: 31 ng/mL (ref 11–307)

## 2021-07-21 NOTE — Progress Notes (Unsigned)
Pt referred by Dr Clint Guy for low iron.

## 2021-07-22 ENCOUNTER — Encounter: Payer: Self-pay | Admitting: Oncology

## 2021-07-22 DIAGNOSIS — E538 Deficiency of other specified B group vitamins: Secondary | ICD-10-CM | POA: Insufficient documentation

## 2021-07-22 DIAGNOSIS — R5383 Other fatigue: Secondary | ICD-10-CM | POA: Insufficient documentation

## 2021-07-22 DIAGNOSIS — E611 Iron deficiency: Secondary | ICD-10-CM | POA: Insufficient documentation

## 2021-07-22 MED ORDER — CYANOCOBALAMIN 500 MCG PO TABS: 500.0000 ug | ORAL_TABLET | Freq: Every day | ORAL | 0 refills | Status: DC

## 2021-07-22 MED ORDER — VITRON-C 65-125 MG PO TABS: 1.0000 | ORAL_TABLET | Freq: Every day | ORAL | 1 refills | Status: DC

## 2021-07-22 NOTE — Progress Notes (Signed)
Hematology/Oncology Consult note [phone] [phone]         Patient Care Team: Armando GangLindley, Cheryl P, FNP as PCP - General (Family Medicine)  REFERRING PROVIDER: Armando GangLindley, Cheryl P, FNP  CHIEF COMPLAINTS/REASON FOR VISIT:  Evaluation of iron deficiency  HISTORY OF PRESENTING ILLNESS:   Ruth Taylor is a  41 y.o.  female with PMH listed below was seen in consultation at the request of  Armando GangLindley, Cheryl P, FNP  for evaluation of iron deficiency  I reviewed patient's recent blood work done at primary provider's office. 05/28/21 iron 34, saturation 8, TIBC 449, UIBC 415, Hb 13.2, mcv 78.6.  Vitamin B12 240, folate 6.6.  Patient reports feeling very tired.  She has heavy menstrual period sometimes.  Denies any black or bloody stool.     Review of Systems  Constitutional:  Positive for fatigue. Negative for appetite change, chills and fever.  HENT:   Negative for hearing loss and voice change.   Eyes:  Negative for eye problems.  Respiratory:  Negative for chest tightness and cough.   Cardiovascular:  Negative for chest pain.  Gastrointestinal:  Negative for abdominal distention, abdominal pain and blood in stool.  Endocrine: Negative for hot flashes.  Genitourinary:  Positive for menstrual problem. Negative for difficulty urinating and frequency.   Musculoskeletal:  Negative for arthralgias.  Skin:  Negative for itching and rash.  Neurological:  Negative for extremity weakness.  Hematological:  Negative for adenopathy.  Psychiatric/Behavioral:  Negative for confusion.    MEDICAL HISTORY:  Past Medical History:  Diagnosis Date   Anxiety    Depression    Graves disease    Graves' disease    Hx of heart failure    "associated with autoimmune"   Hyperthyroidism    PTSD (post-traumatic stress disorder)     SURGICAL HISTORY: Past Surgical History:  Procedure Laterality Date   NASAL SINUS SURGERY     TONSILLECTOMY      SOCIAL HISTORY: Social History    Socioeconomic History   Marital status: Single    Spouse name: Not on file   Number of children: 2   Years of education: Not on file   Highest education level: Not on file  Occupational History   Not on file  Tobacco Use   Smoking status: Former    Types: Cigarettes   Smokeless tobacco: Never  Vaping Use   Vaping Use: Never used  Substance and Sexual Activity   Alcohol use: Not Currently   Drug use: Not Currently    Comment: marijuana in past   Sexual activity: Yes    Comment: will be starting BC soon  Other Topics Concern   Not on file  Social History Narrative   Lives alone, children are there sometimes    Right handed   Caffeine: 1-2 cups/day   Social Determinants of Health   Financial Resource Strain: Not on file  Food Insecurity: Not on file  Transportation Needs: Not on file  Physical Activity: Not on file  Stress: Not on file  Social Connections: Not on file  Intimate Partner Violence: Not on file    FAMILY HISTORY: Family History  Problem Relation Age of Onset   Stroke Father        quadriplegic, brain stem stroke   Heart Problems Paternal Uncle    Breast cancer Maternal Grandmother    Heart Problems Paternal Grandmother    Prostate cancer Paternal Grandfather    Heart Problems Other  mother's side   Thyroid disease Other        mother's side    Stroke Other        mother's side    Cancer Other        mother's side    Neuromuscular disorder Neg Hx    Cancer - Colon Neg Hx     ALLERGIES:  is allergic to wellbutrin [bupropion] and sulfa antibiotics.  MEDICATIONS:  Current Outpatient Medications  Medication Sig Dispense Refill   clonazePAM (KLONOPIN) 0.5 MG tablet Take 0.5 mg by mouth 2 (two) times daily as needed.     METHIMAZOLE PO Take 1 each by mouth in the morning, at noon, and at bedtime.      No current facility-administered medications for this visit.     PHYSICAL EXAMINATION:  Vitals:   07/21/21 1526  BP: 117/76   Pulse: 98  Resp: 16  Temp: 97.8 F (36.6 C)  SpO2: 99%   Filed Weights   07/21/21 1526  Weight: 145 lb (65.8 kg)    Physical Exam Constitutional:      General: She is not in acute distress. HENT:     Head: Normocephalic and atraumatic.  Eyes:     General: No scleral icterus. Cardiovascular:     Rate and Rhythm: Normal rate and regular rhythm.     Heart sounds: Normal heart sounds.  Pulmonary:     Effort: Pulmonary effort is normal. No respiratory distress.     Breath sounds: No wheezing.  Abdominal:     General: Bowel sounds are normal. There is no distension.     Palpations: Abdomen is soft.  Musculoskeletal:        General: No deformity. Normal range of motion.     Cervical back: Normal range of motion and neck supple.  Skin:    General: Skin is warm and dry.     Findings: No erythema or rash.  Neurological:     Mental Status: She is alert and oriented to person, place, and time. Mental status is at baseline.     Cranial Nerves: No cranial nerve deficit.     Coordination: Coordination normal.  Psychiatric:        Mood and Affect: Mood normal.    LABORATORY DATA:  I have reviewed the data as listed Lab Results  Component Value Date   WBC 5.7 07/21/2021   HGB 14.5 07/21/2021   HCT 45.0 07/21/2021   MCV 79.8 (L) 07/21/2021   PLT 304 07/21/2021   Recent Labs    07/21/21 1613  NA 139  K 4.7  CL 104  CO2 27  GLUCOSE 104*  BUN 10  CREATININE 0.58  CALCIUM 9.1  GFRNONAA >60  PROT 7.8  ALBUMIN 4.2  AST 39  ALT 38  ALKPHOS 130*  BILITOT 0.4   Iron/TIBC/Ferritin/ %Sat    Component Value Date/Time   IRON 38 07/21/2021 1613   TIBC 452 (H) 07/21/2021 1613   FERRITIN 31 07/21/2021 1613   IRONPCTSAT 8 (L) 07/21/2021 1613      RADIOGRAPHIC STUDIES: I have personally reviewed the radiological images as listed and agreed with the findings in the report. No results found.    ASSESSMENT & PLAN:  1. Iron deficiency   2. Other fatigue   3. Low  serum vitamin B12    #Iron deficiency, with no anemia. I will repeat CBC, check smear, iron TIBC ferritin. Patient does not have anemia. Etiology of the iron deficiency could be secondary  to blood loss from heavy menstrual period Discussed about option of oral iron supplementation IV Venofer treatments. Given that she does not have anemia, I recommend patient to start with oral iron supplementation.  Prescription will be sent to pharmacy.  #Fatigue is in proportionate to her level of hemoglobin.  Today's visit hemoglobin was completely normal. Fatigue could be secondary to her Graves' disease.  Continue follow-up with primary care provider.  #Patient had a vitamin B12 level at 240.  Borderline low.  Recommend patient to start vitamin B12 supplementation.  Orders Placed This Encounter  Procedures   CBC with Differential/Platelet    Standing Status:   Future    Number of Occurrences:   1    Standing Expiration Date:   07/22/2022   Ferritin    Standing Status:   Future    Number of Occurrences:   1    Standing Expiration Date:   01/21/2022   Iron and TIBC    Standing Status:   Future    Number of Occurrences:   1    Standing Expiration Date:   07/22/2022   Technologist smear review    Standing Status:   Future    Number of Occurrences:   1    Standing Expiration Date:   07/22/2022   Retic Panel    Standing Status:   Future    Number of Occurrences:   1    Standing Expiration Date:   07/22/2022   Comprehensive metabolic panel    Standing Status:   Future    Number of Occurrences:   1    Standing Expiration Date:   07/21/2022    All questions were answered. The patient knows to call the clinic with any problems questions or concerns.   Armando Gang, FNP    Return of visit: We will schedule patient to follow-up in 4 months. Thank you for this kind referral and the opportunity to participate in the care of this patient. A copy of today's note is routed to referring provider    Rickard Patience, MD, PhD [facility] Hematology Oncology 07/22/2021

## 2021-07-23 ENCOUNTER — Telehealth: Payer: Self-pay

## 2021-07-23 NOTE — Telephone Encounter (Signed)
Called and informed patient of results and Dr. Bethanne Ginger recommendation. Patient verbalized understanding.    Roderic Palau, please schedule patient for:  Labs in 4 month MD 1-2 days after Please notify patient of appt. Thanks

## 2021-07-23 NOTE — Telephone Encounter (Signed)
-----   Message from Rickard Patience, MD sent at 07/22/2021  6:08 PM EDT ----- Let her know that blood work showed low iron level, without decrease of hemoglobin.  I recommend patient to take oral iron supplementation.  I sent Vitron-C to her pharmacy.  Also her vitamin B12 level done at primary care provider's office is a lower normal end.  I recommend patient to take B12 supplements.  I sent a prescription to her pharmacy. Follow-up plan, Lab in 4 months, prior to MD visit.  Labs are ordered.

## 2021-08-27 DIAGNOSIS — H6121 Impacted cerumen, right ear: Secondary | ICD-10-CM | POA: Diagnosis not present

## 2021-11-23 ENCOUNTER — Inpatient Hospital Stay: Payer: BC Managed Care – PPO | Attending: Family Medicine

## 2021-11-25 ENCOUNTER — Inpatient Hospital Stay: Payer: BC Managed Care – PPO | Admitting: Oncology

## 2021-11-26 DIAGNOSIS — E559 Vitamin D deficiency, unspecified: Secondary | ICD-10-CM | POA: Diagnosis not present

## 2021-11-26 DIAGNOSIS — D529 Folate deficiency anemia, unspecified: Secondary | ICD-10-CM | POA: Diagnosis not present

## 2021-11-26 DIAGNOSIS — E785 Hyperlipidemia, unspecified: Secondary | ICD-10-CM | POA: Diagnosis not present

## 2021-11-26 DIAGNOSIS — E7800 Pure hypercholesterolemia, unspecified: Secondary | ICD-10-CM | POA: Diagnosis not present

## 2021-11-26 DIAGNOSIS — Z79899 Other long term (current) drug therapy: Secondary | ICD-10-CM | POA: Diagnosis not present

## 2021-11-26 DIAGNOSIS — F339 Major depressive disorder, recurrent, unspecified: Secondary | ICD-10-CM | POA: Diagnosis not present

## 2021-11-26 DIAGNOSIS — F4310 Post-traumatic stress disorder, unspecified: Secondary | ICD-10-CM | POA: Diagnosis not present

## 2021-11-26 DIAGNOSIS — D519 Vitamin B12 deficiency anemia, unspecified: Secondary | ICD-10-CM | POA: Diagnosis not present

## 2021-11-26 DIAGNOSIS — F419 Anxiety disorder, unspecified: Secondary | ICD-10-CM | POA: Diagnosis not present

## 2021-12-26 DIAGNOSIS — R590 Localized enlarged lymph nodes: Secondary | ICD-10-CM | POA: Diagnosis not present

## 2022-01-11 ENCOUNTER — Inpatient Hospital Stay: Payer: BC Managed Care – PPO | Attending: Family Medicine | Admitting: Oncology

## 2022-03-30 DIAGNOSIS — R799 Abnormal finding of blood chemistry, unspecified: Secondary | ICD-10-CM | POA: Diagnosis not present

## 2022-03-30 DIAGNOSIS — Z01419 Encounter for gynecological examination (general) (routine) without abnormal findings: Secondary | ICD-10-CM | POA: Diagnosis not present

## 2022-03-30 DIAGNOSIS — I10 Essential (primary) hypertension: Secondary | ICD-10-CM | POA: Diagnosis not present

## 2022-03-30 DIAGNOSIS — R7989 Other specified abnormal findings of blood chemistry: Secondary | ICD-10-CM | POA: Diagnosis not present

## 2022-03-30 DIAGNOSIS — E785 Hyperlipidemia, unspecified: Secondary | ICD-10-CM | POA: Diagnosis not present

## 2022-06-01 ENCOUNTER — Ambulatory Visit
Admission: RE | Admit: 2022-06-01 | Discharge: 2022-06-01 | Disposition: A | Discharge status: Home / Self Care | Payer: BC Managed Care – PPO | Source: Ambulatory Visit | Attending: Family Medicine | Admitting: Family Medicine

## 2022-06-01 VITALS — BP 109/71 | HR 84 | Temp 98.1°F | Resp 16

## 2022-06-01 DIAGNOSIS — R109 Unspecified abdominal pain: Secondary | ICD-10-CM | POA: Diagnosis not present

## 2022-06-01 NOTE — ED Provider Triage Note (Signed)
Emergency Medicine Provider Triage Evaluation Note  Ruth Taylor , a 42 y.o. female  was evaluated in triage by provider.   Charma Igo, [place] 06/01/22 1939

## 2022-06-01 NOTE — ED Triage Notes (Signed)
Triaged by provider  

## 2022-06-01 NOTE — ED Provider Notes (Signed)
[name]    CSN: [id number] Arrival date & time: 06/01/22  1847      History   Chief Complaint No chief complaint on file.   HPI Ruth Taylor is a 42 y.o. female.   HPI  Triage by provider.  Patient presents to urgent care with complaint of right sided pain times several weeks.  She states she was moving something heavy after which she started to experience tenderness to her right flank.  She presumed it was a muscle tear and tried to rest but she states that pain is not yet resolving.  Denies any urinary issues.  Pain is worse when she breathes deeply or when she moves or touches the area.  Denies fever, chills, body aches.  Denies shortness of breath.  Past Medical History:  Diagnosis Date   Anxiety    Depression    Graves disease    Graves' disease    Hx of heart failure    "associated with autoimmune"   Hyperthyroidism    PTSD (post-traumatic stress disorder)     Patient Active Problem List   Diagnosis Date Noted   Iron deficiency 07/22/2021   Other fatigue 07/22/2021   Low serum vitamin B12 07/22/2021   Graves' disease 08/25/2020   Hyperthyroidism 11/12/2019    Past Surgical History:  Procedure Laterality Date   NASAL SINUS SURGERY     TONSILLECTOMY      OB History   No obstetric history on file.      Home Medications    Prior to Admission medications   Medication Sig Start Date End Date Taking? Authorizing Provider  clonazePAM (KLONOPIN) 0.5 MG tablet Take 0.5 mg by mouth 2 (two) times daily as needed. 11/08/19   [provider]  Iron-Vitamin C (VITRON-C) 65-125 MG TABS Take 1 tablet by mouth daily. 07/22/21   Rickard Patience, MD  METHIMAZOLE PO Take 1 each by mouth in the morning, at noon, and at bedtime.     [provider]  vitamin B-12 (CYANOCOBALAMIN) 500 MCG tablet Take 1 tablet (500 mcg total) by mouth daily. 07/22/21   Rickard Patience, MD    Family History Family History  Problem Relation Age of Onset   Stroke Father         quadriplegic, brain stem stroke   Heart Problems Paternal Uncle    Breast cancer Maternal Grandmother    Heart Problems Paternal Grandmother    Prostate cancer Paternal Grandfather    Heart Problems Other        mother's side   Thyroid disease Other        mother's side    Stroke Other        mother's side    Cancer Other        mother's side    Neuromuscular disorder Neg Hx    Cancer - Colon Neg Hx     Social History Social History   Tobacco Use   Smoking status: Former    Types: Cigarettes   Smokeless tobacco: Never  Vaping Use   Vaping Use: Never used  Substance Use Topics   Alcohol use: Not Currently   Drug use: Not Currently    Comment: marijuana in past     Allergies   Wellbutrin [bupropion] and Sulfa antibiotics   Review of Systems Review of Systems   Physical Exam Triage Vital Signs ED Triage Vitals [06/01/22 1927]  Enc Vitals Group     BP      Pulse  Resp      Temp      Temp src      SpO2      Weight      Height      Head Circumference      Peak Flow      Pain Score 0     Pain Loc      Pain Edu?      Excl. in GC?    No data found.  Updated Vital Signs There were no vitals taken for this visit.  Visual Acuity Right Eye Distance:   Left Eye Distance:   Bilateral Distance:    Right Eye Near:   Left Eye Near:    Bilateral Near:     Physical Exam Vitals reviewed.  Constitutional:      Appearance: Normal appearance.  Abdominal:     General: Abdomen is flat.     Palpations: Abdomen is soft.     Tenderness: There is no right CVA tenderness or left CVA tenderness.    Skin:    General: Skin is warm and dry.     Findings: No erythema.  Neurological:     Mental Status: She is alert.      UC Treatments / Results  Labs (all labs ordered are listed, but only abnormal results are displayed) Labs Reviewed - No data to display  EKG   Radiology No results found.  Procedures Procedures (including critical care  time)  Medications Ordered in UC Medications - No data to display  Initial Impression / Assessment and Plan / UC Course  I have reviewed the triage vital signs and the nursing notes.  Pertinent labs & imaging results that were available during my care of the patient were reviewed by me and considered in my medical decision making (see chart for details).   Presumed strain of abdominal muscle and recommending rest.  Suggesting heat therapy, topical diclofenac or lidocaine.  Counseled patient on potential for adverse effects with medications prescribed/recommended today, ER and return-to-clinic precautions discussed, patient verbalized understanding and agreement with care plan.   Final Clinical Impressions(s) / UC Diagnoses   Final diagnoses:  None   Discharge Instructions   None    ED Prescriptions   None    PDMP not reviewed this encounter.   Charma Igo, [place] 06/01/22 1942

## 2022-06-01 NOTE — Discharge Instructions (Addendum)
Recommend you use topical pain relievers such as diclofenac, lidocaine.  You may also try heat therapy. Follow up here or with your primary care provider if your symptoms are worsening or not improving.

## 2022-06-24 IMAGING — CT CT HEAD W/O CM
3 series · 16 of 47 positions shown, 19 images · non-contrast
Comparison: None

CLINICAL DATA: Dizziness.

EXAM:
CT HEAD WITHOUT CONTRAST
TECHNIQUE: Contiguous axial images were obtained from the base of the skull
through the vertex without intravenous contrast.

[Series 2: head wo · axial · 0.42mm/px · z∈[-146,-16]mm · 10 of 32 slices shown, 13 images]
[im 3/32  brain]
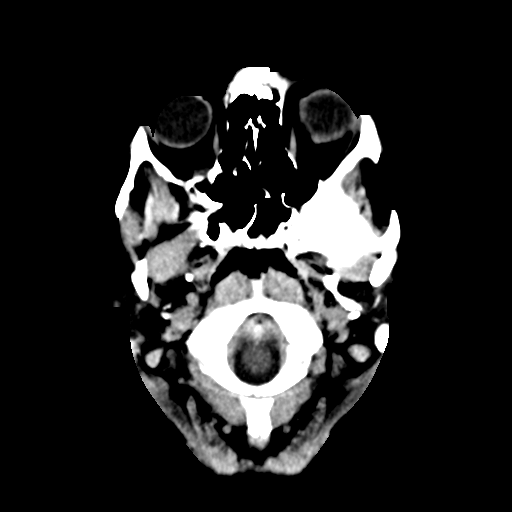
[im 3/32  bone]
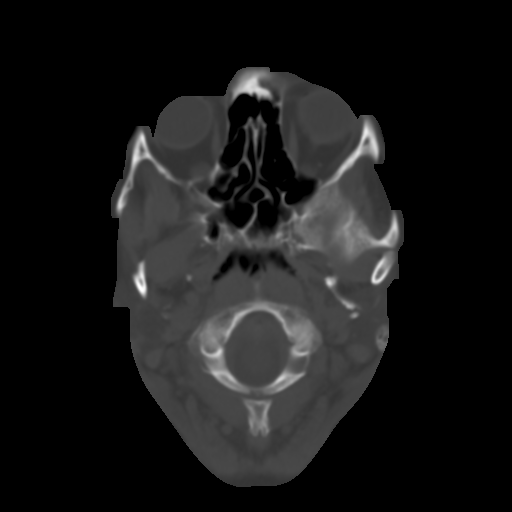
[im 6/32  brain]
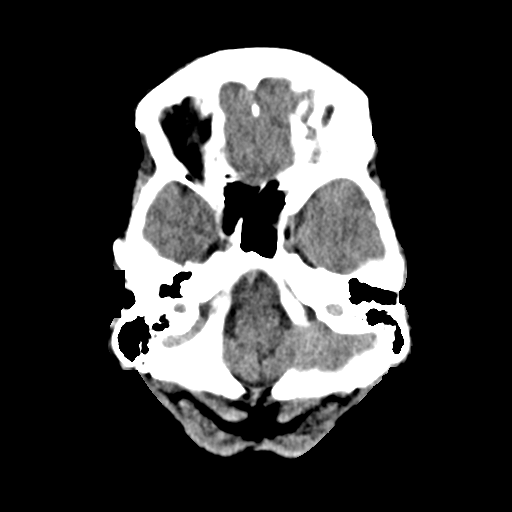
[im 9/32  brain]
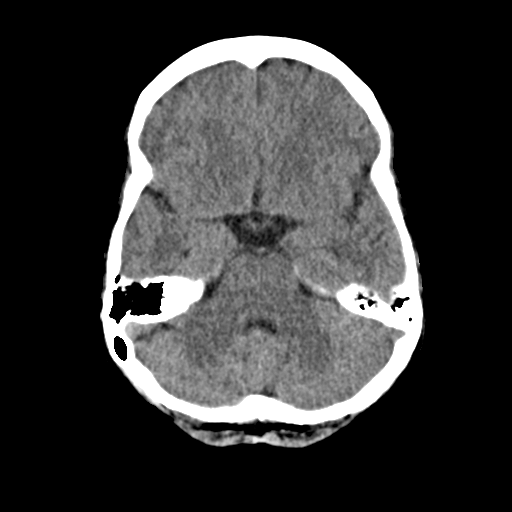
[im 11/32  brain]
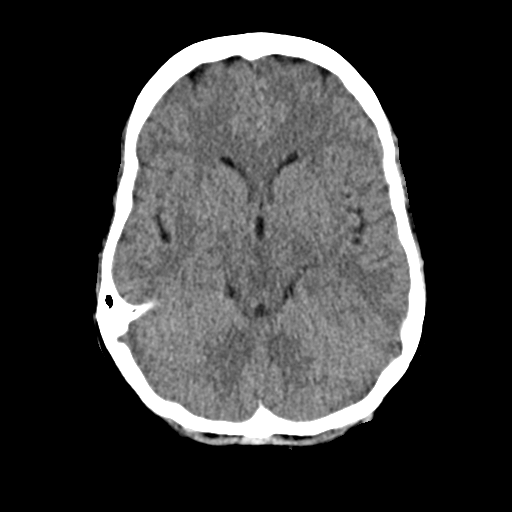
[im 14/32  brain]
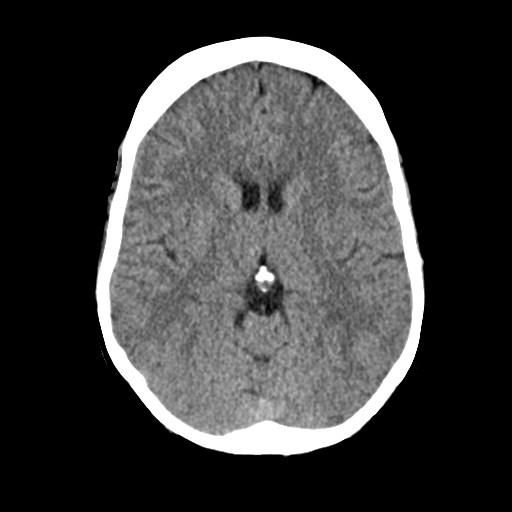
[im 14/32  bone]
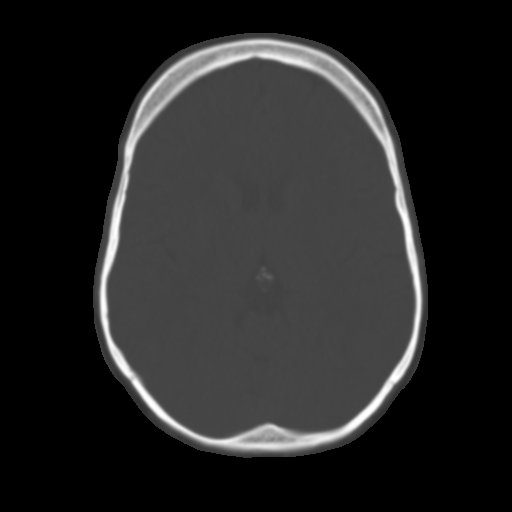
[im 18/32  brain]
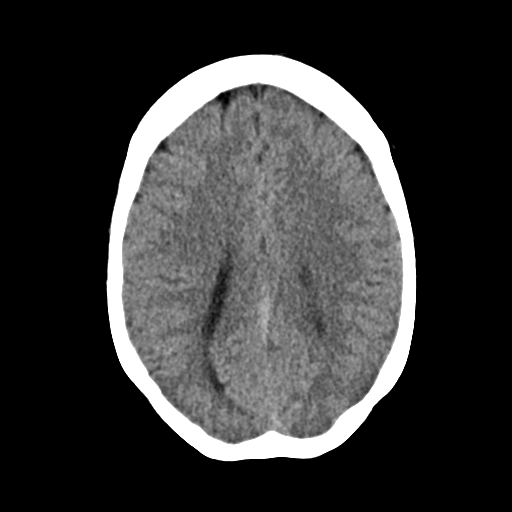
[im 21/32  brain]
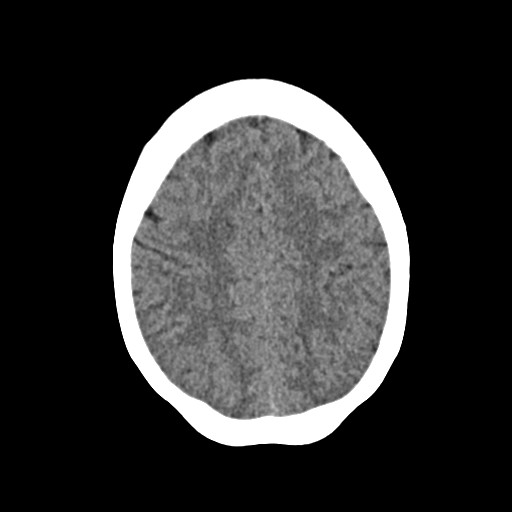
[im 24/32  brain]
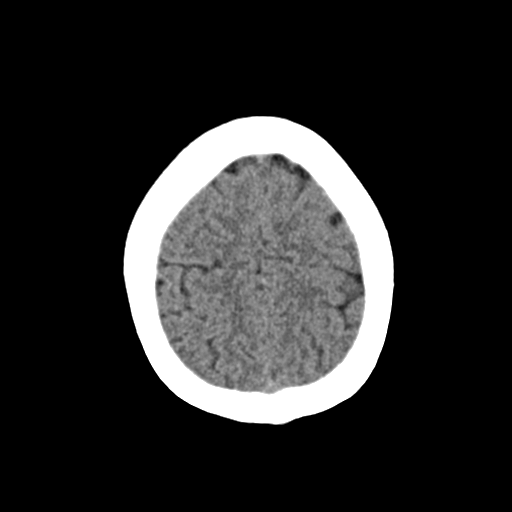
[im 26/32  brain]
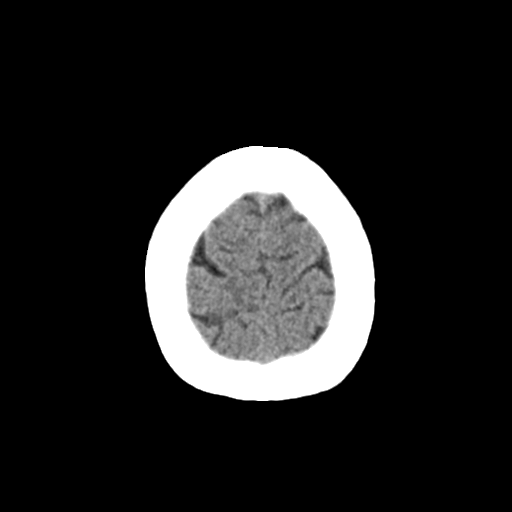
[im 26/32  bone]
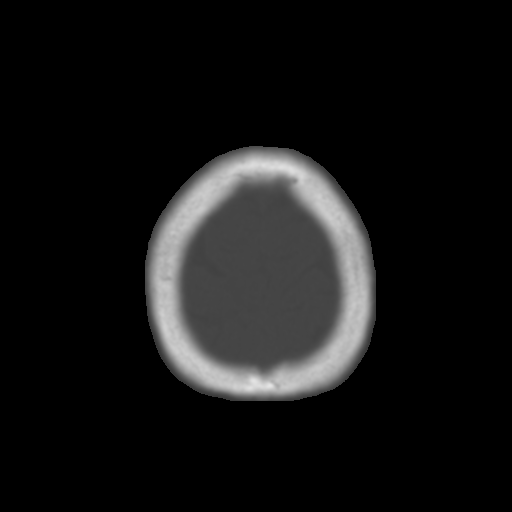
[im 29/32  brain]
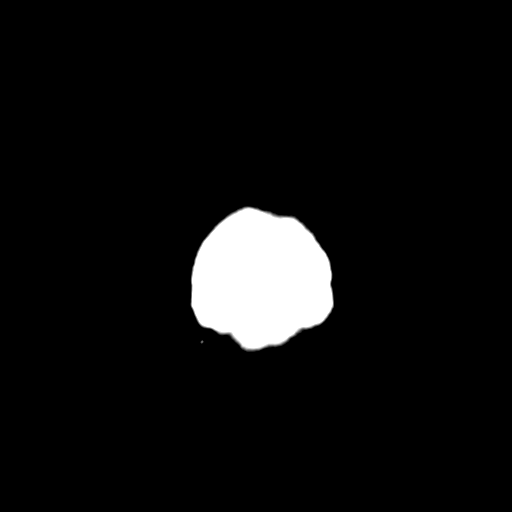

[Series 4: coronal soft · coronal · 0.31mm/px · 3 of 63 slices shown]
[im 21/63  brain]
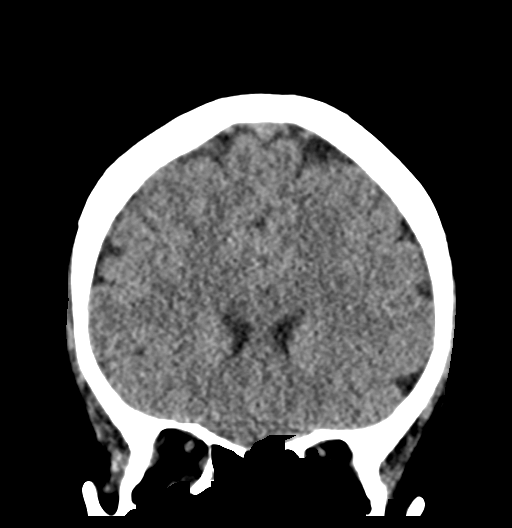
[im 28/63  brain]
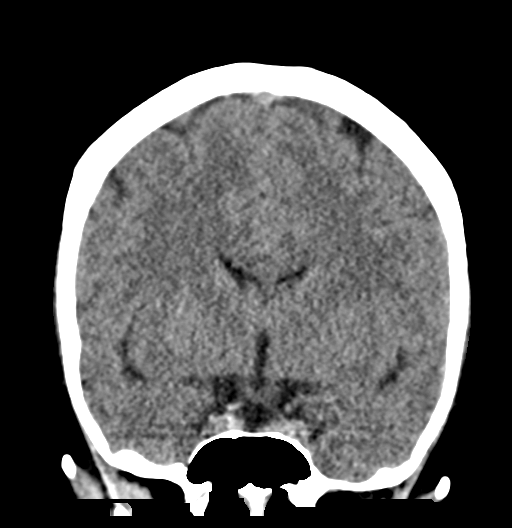
[im 35/63  brain]
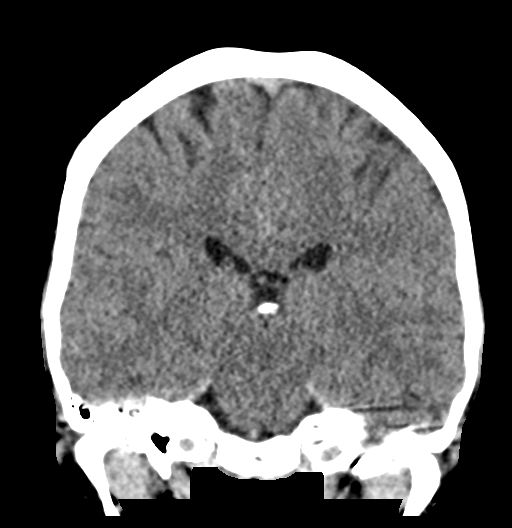

[Series 5: sag soft · sagittal · 0.32mm/px · 3 of 50 slices shown]
[im 17/50  brain]
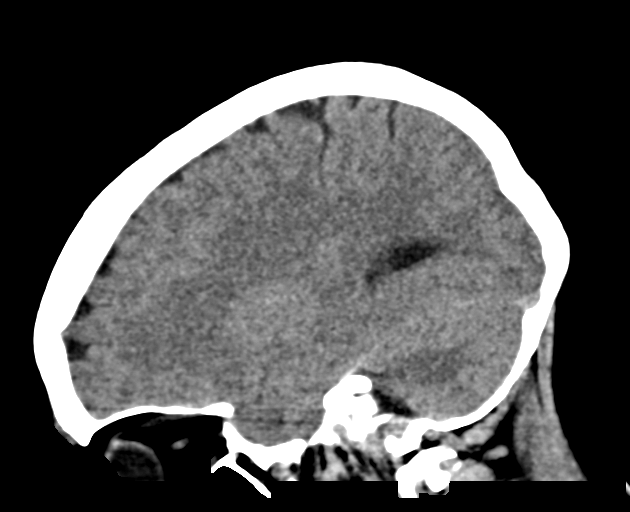
[im 25/50  brain]
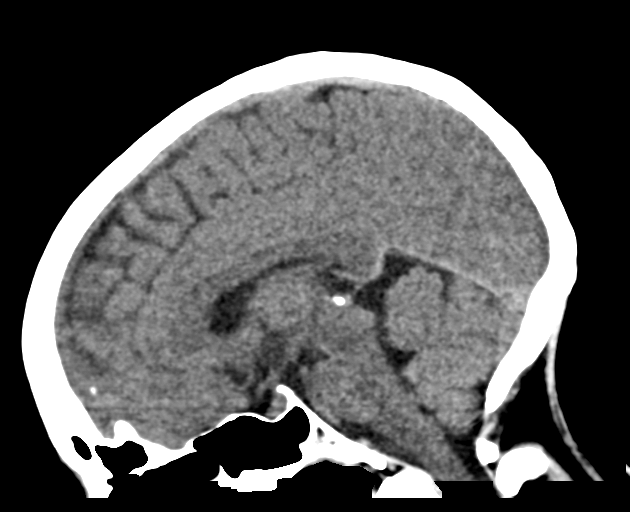
[im 33/50  brain]
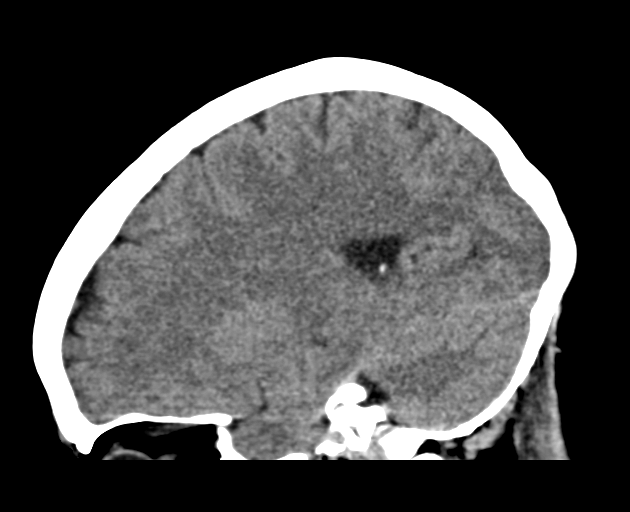

[16 of 47 positions shown; findings below may reference images not displayed]

FINDINGS: Brain: No evidence of acute infarction, hemorrhage, hydrocephalus,
extra-axial collection or mass lesion/mass effect.

Vascular: No hyperdense vessel or unexpected calcification.

Skull: Normal. Negative for fracture or focal lesion.

Sinuses/Orbits: Post surgical changes from bilateral median
antrectomy. Proptosis noted bilaterally.

Other: None.
IMPRESSION: 1. No acute intracranial abnormalities. Normal brain.
2. Bilateral proptosis.

## 2022-07-01 DIAGNOSIS — N760 Acute vaginitis: Secondary | ICD-10-CM | POA: Diagnosis not present

## 2022-07-08 ENCOUNTER — Ambulatory Visit
Admission: EM | Admit: 2022-07-08 | Discharge: 2022-07-08 | Disposition: A | Discharge status: Home / Self Care | Payer: BC Managed Care – PPO | Attending: Urgent Care | Admitting: Urgent Care

## 2022-07-08 DIAGNOSIS — N760 Acute vaginitis: Secondary | ICD-10-CM | POA: Diagnosis not present

## 2022-07-08 DIAGNOSIS — B9689 Other specified bacterial agents as the cause of diseases classified elsewhere: Secondary | ICD-10-CM

## 2022-07-08 MED ORDER — CLINDAMYCIN HCL 300 MG PO CAPS: 300.0000 mg | ORAL_CAPSULE | Freq: Two times a day (BID) | ORAL | 0 refills | Status: AC

## 2022-07-08 NOTE — Discharge Instructions (Signed)
Follow up here or with your primary care provider if your symptoms are worsening or not improving with treatment.     

## 2022-07-08 NOTE — ED Triage Notes (Signed)
Patient presents to UC for generalized rash on face, upper extremities since 2 days ago. Has not taken any medications.

## 2022-07-08 NOTE — ED Provider Notes (Signed)
Ruth Taylor    CSN: [id number] Arrival date & time: 07/08/22  1924      History   Chief Complaint Chief Complaint  Patient presents with   Rash    HPI Ruth Taylor is a 42 y.o. female.    Rash   Patient presents to urgent care requesting treatment for bacterial vaginosis.  She states [facility] at [facility] for this and was prescribed metronidazole.  When she went to pick up the medication at her pharmacy, she was informed that she could not take this medication as she had an allergy.  She contacted [facility] and asked for an alternative medication but has not received a response.  Presents today stating that she is now having infection in the form of a rash on her face.  She states she has "cellulitis".  Past Medical History:  Diagnosis Date   Anxiety    Depression    Graves disease    Graves' disease    Hx of heart failure    "associated with autoimmune"   Hyperthyroidism    PTSD (post-traumatic stress disorder)     Patient Active Problem List   Diagnosis Date Noted   Iron deficiency 07/22/2021   Other fatigue 07/22/2021   Low serum vitamin B12 07/22/2021   Graves' disease 08/25/2020   Hyperthyroidism 11/12/2019    Past Surgical History:  Procedure Laterality Date   NASAL SINUS SURGERY     TONSILLECTOMY      OB History   No obstetric history on file.      Home Medications    Prior to Admission medications   Medication Sig Start Date End Date Taking? Authorizing Provider  clonazePAM (KLONOPIN) 0.5 MG tablet Take 0.5 mg by mouth 2 (two) times daily as needed. 11/08/19   [provider]  Iron-Vitamin C (VITRON-C) 65-125 MG TABS Take 1 tablet by mouth daily. 07/22/21   Rickard Patience, MD  METHIMAZOLE PO Take 1 each by mouth in the morning, at noon, and at bedtime.     [provider]  vitamin B-12 (CYANOCOBALAMIN) 500 MCG tablet Take 1 tablet (500 mcg total) by mouth daily. 07/22/21   Rickard Patience, MD    Family  History Family History  Problem Relation Age of Onset   Stroke Father        quadriplegic, brain stem stroke   Heart Problems Paternal Uncle    Breast cancer Maternal Grandmother    Heart Problems Paternal Grandmother    Prostate cancer Paternal Grandfather    Heart Problems Other        mother's side   Thyroid disease Other        mother's side    Stroke Other        mother's side    Cancer Other        mother's side    Neuromuscular disorder Neg Hx    Cancer - Colon Neg Hx     Social History Social History   Tobacco Use   Smoking status: Former    Types: Cigarettes   Smokeless tobacco: Never  Vaping Use   Vaping Use: Never used  Substance Use Topics   Alcohol use: Not Currently   Drug use: Not Currently    Comment: marijuana in past     Allergies   Wellbutrin [bupropion], Metronidazole, and Sulfa antibiotics   Review of Systems Review of Systems  Skin:  Positive for rash.     Physical Exam Triage  Vital Signs ED Triage Vitals  Enc Vitals Group     BP      Pulse      Resp      Temp      Temp src      SpO2      Weight      Height      Head Circumference      Peak Flow      Pain Score      Pain Loc      Pain Edu?      Excl. in GC?    No data found.  Updated Vital Signs There were no vitals taken for this visit.  Visual Acuity Right Eye Distance:   Left Eye Distance:   Bilateral Distance:    Right Eye Near:   Left Eye Near:    Bilateral Near:     Physical Exam Vitals reviewed.  Constitutional:      Appearance: Normal appearance.  Skin:    General: Skin is warm and dry.  Neurological:     General: No focal deficit present.     Mental Status: She is alert and oriented to person, place, and time.  Psychiatric:        Mood and Affect: Mood normal.        Behavior: Behavior normal.      UC Treatments / Results  Labs (all labs ordered are listed, but only abnormal results are displayed) Labs Reviewed - No data to  display  EKG   Radiology No results found.  Procedures Procedures (including critical care time)  Medications Ordered in UC Medications - No data to display  Initial Impression / Assessment and Plan / UC Course  I have reviewed the triage vital signs and the nursing notes.  Pertinent labs & imaging results that were available during my care of the patient were reviewed by me and considered in my medical decision making (see chart for details).   Found positive result of bacterial vaginosis in the patient's chart.  Prescribing clindamycin as an alternative to metronidazole for which she has an intolerance.  Informed the patient that clindamycin is often prescribed for skin infections and may be effective for resolving her cystic acne as well.  Otherwise she should follow-up with dermatology or her primary care provider.  Reviewed chart history.   Counseled patient on potential for adverse effects with medications prescribed/recommended today, ER and return-to-clinic precautions discussed, patient verbalized understanding and agreement with care plan.    Final Clinical Impressions(s) / UC Diagnoses   Final diagnoses:  BV (bacterial vaginosis)   Discharge Instructions   None    ED Prescriptions   None    PDMP not reviewed this encounter.   Charma Igo, [place] 07/08/22 1935

## 2022-11-16 ENCOUNTER — Telehealth (HOSPITAL_COMMUNITY): Payer: Self-pay | Admitting: Psychiatry

## 2022-11-16 ENCOUNTER — Ambulatory Visit (HOSPITAL_COMMUNITY)
Admission: EM | Admit: 2022-11-16 | Discharge: 2022-11-16 | Disposition: A | Discharge status: Home / Self Care | Payer: BC Managed Care – PPO

## 2022-11-16 DIAGNOSIS — F4323 Adjustment disorder with mixed anxiety and depressed mood: Secondary | ICD-10-CM | POA: Diagnosis not present

## 2022-11-16 NOTE — Telephone Encounter (Signed)
D:  Dr. Lucianne Muss referred pt to virtual MH-IOP or individual therapy (if pt declines group).  A: Placed call to orient pt, but there was no answer and voicemail box is full.  Will attempt to call again later or tomorrow.  Inform Dr. Lucianne Muss and Ava S.

## 2022-11-16 NOTE — Progress Notes (Signed)
   11/16/22 1121  [facility] Triage Screening (Walk-ins at [facility] only)  How Did You Hear About [name]? Self  What Is the Reason for Your Visit/Call Today? Pt presents to [facility] voluntarily and unaccompanied due to increased depression symptoms. Pt reports losing her grandma in March which was 2 days before her birthday. She reports taking care of her for 3 years with dementia by herself. 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 Pt reports recent increased alcohol use for the past couple of weeks due to the stress. Pt denies any previous diagnosis and has never been hospitalized for psychiatric reasons. Pt denies SI/HI and AVH.  How Long Has This Been Causing You Problems? > than 6 months  Have You Recently Had Any Thoughts About Hurting Yourself? No  Are You Planning to Commit Suicide/Harm Yourself At This time? No  Have you Recently Had Thoughts About Hurting Someone Karolee Ohs? No  Are You Planning To Harm Someone At This Time? No  Are you currently experiencing any auditory, visual or other hallucinations? No  Have You Used Any Alcohol or Drugs in the Past 24 Hours? Yes  How long ago did you use Drugs or Alcohol? yesterday  What Did You Use and How Much? wine  Do you have any current medical co-morbidities that require immediate attention? No  Clinician description of patient physical appearance/behavior: tearful, casually dressed  What Do You Feel Would Help You the Most Today? Treatment for Depression or other mood problem  If access to [facility] was not available, would you have sought care in the Emergency Department? No  Determination of Need Routine (7 days)  Options For Referral Outpatient Therapy;Medication Management

## 2022-11-16 NOTE — ED Provider Notes (Signed)
Behavioral Health Urgent Care Medical Screening Exam  Patient Name: Ruth Taylor MRN: [id number] Date of Evaluation: 11/16/22 Chief Complaint:   Diagnosis:  Final diagnoses:  Adjustment disorder with mixed anxiety and depressed mood    History of Present illness: [name] is a 42 y.o. female.  Who presents today as a walk-in to [facility] urgent care.  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  Patient also reports for the past 3 weeks because of the stress at work, she has been drinking 1/2 a bottle of wine every evening, was not not drinking in the past.  She feels frustrated with the situation, wants help, as that she tried to apply for FMLA and was told no.  Patient also states that she does not see her PCP any longer as she has a lot of bills, is not able to manage her finances and losing her job would be stressful.  Patient does endorse symptoms of depression include feelings of hopelessness, worthlessness, feeling sad and overwhelmed.  Patient reports that she has been sleeping okay at night.  She states that she struggles with low self-esteem, is trying to manage her 19 year old daughter who return back from [place].  She has that her ex-husband and ex mother-in-law are part of a cult in [place] and that she has been fighting them for custody of her kids for 12 years now which has drained her finances.  She has that her daughter returned because she turned 24 and she is trying to deal with the trauma her daughter went to which is also overwhelming for her.  She states that she can keep herself safe, feels she needs help and is okay with doing intensive outpatient program to help stabilize her mood, feeling overwhelmed, struggling with day-to-day activities.   Patient states that she can contract for safety if she wants to be there for 43 year old, is  trying to get custody and visitation for her 34 year old son.  She denies any psychotic symptoms, any past psychiatric history, any suicide attempts, any self mutilating behaviors, any access to guns.   Flowsheet Row ED from 11/16/2022 in [facility] ED from 07/08/2022 in [facility] at [facility]  ED from 08/25/2020 in [facility] at [facility] RISK CATEGORY No Risk No Risk No Risk       Psychiatric Specialty Exam  Presentation  General Appearance:Casual  Eye Contact:Fair  Speech:Clear and Coherent  Speech Volume:Normal  Handedness:Right   Mood and Affect  Mood: Anxious; Hopeless  Affect: Tearful; Congruent   Thought Process  Thought Processes: Coherent  Descriptions of Associations:Intact  Orientation:Full (Time, Place and Person)  Thought Content:Logical; Rumination    Hallucinations:None  Ideas of Reference:None  Suicidal Thoughts:No data recorded Homicidal Thoughts:No data recorded  Sensorium  Memory:No data recorded Judgment:No data recorded Insight:No data recorded  Executive Functions  Concentration: Fair  Attention Span: Fair  Recall: Fair  Fund of Knowledge: Fair  Language: Fair   Psychomotor Activity  Psychomotor Activity: Normal   Assets  Assets: Manufacturing systems engineer; Desire for Improvement; Vocational/Educational; Housing; Health and safety inspector; Social Support; Talents/Skills; Transportation   Sleep  Sleep: Fair  Number of hours:  7   Physical Exam: Physical Exam Vitals reviewed.  Constitutional:      Appearance: Normal appearance.  HENT:     Head: Normocephalic  and atraumatic.     Nose: Nose normal.     Mouth/Throat:     Mouth: Mucous membranes are moist.  Eyes:     Extraocular Movements: Extraocular movements intact.     Conjunctiva/sclera: Conjunctivae normal.     Pupils: Pupils are equal, round, and reactive to light.  Cardiovascular:      Rate and Rhythm: Normal rate and regular rhythm.     Pulses: Normal pulses.     Heart sounds: Normal heart sounds.  Pulmonary:     Effort: Pulmonary effort is normal.     Breath sounds: Normal breath sounds.  Musculoskeletal:        General: Normal range of motion.     Cervical back: Normal range of motion and neck supple.  Skin:    General: Skin is warm.  Neurological:     General: No focal deficit present.     Mental Status: She is alert and oriented to person, place, and time.    Review of Systems  Constitutional: Negative.  Negative for malaise/fatigue and weight loss.  HENT: Negative.  Negative for congestion, hearing loss, sinus pain and sore throat.   Eyes: Negative.  Negative for blurred vision, double vision, discharge and redness.  Respiratory: Negative.  Negative for cough, shortness of breath and wheezing.   Cardiovascular: Negative.  Negative for chest pain and palpitations.  Gastrointestinal: Negative.  Negative for heartburn and nausea.  Genitourinary: Negative.   Musculoskeletal: Negative.   Skin: Negative.   Neurological: Negative.  Negative for dizziness, focal weakness, seizures, loss of consciousness, weakness and headaches.  Endo/Heme/Allergies: Negative.   Psychiatric/Behavioral:  Positive for depression and substance abuse. Negative for hallucinations, memory loss and suicidal ideas. The patient is nervous/anxious. The patient does not have insomnia.    Blood pressure (!) 148/81, pulse 87, temperature 97.9 F (36.6 C), temperature source Oral, resp. rate 18, SpO2 99%. There is no height or weight on file to calculate BMI.  Musculoskeletal: Strength & Muscle Tone: within normal limits Gait & Station: normal Patient leans: N/A   [facility] MSE Discharge Disposition for Follow up and Recommendations: Based on my evaluation the patient does not appear to have an emergency medical condition and can be discharged with resources and follow up care in outpatient  services for intensive outpatient program at the [facility] [place] office.   Nelly Rout, MD 11/16/2022, 4:51 PM

## 2022-11-23 ENCOUNTER — Other Ambulatory Visit (HOSPITAL_COMMUNITY): Payer: BC Managed Care – PPO | Attending: Psychiatry | Admitting: Psychiatry
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
## 2022-11-24 ENCOUNTER — Encounter (HOSPITAL_COMMUNITY): Payer: Self-pay

## 2022-11-24 ENCOUNTER — Other Ambulatory Visit (HOSPITAL_COMMUNITY): Payer: BC Managed Care – PPO | Attending: Psychiatry | Admitting: Psychiatry

## 2022-11-24 DIAGNOSIS — Z0000 Encounter for general adult medical examination without abnormal findings: Secondary | ICD-10-CM | POA: Insufficient documentation

## 2022-11-24 DIAGNOSIS — F331 Major depressive disorder, recurrent, moderate: Secondary | ICD-10-CM | POA: Insufficient documentation

## 2022-11-24 DIAGNOSIS — F339 Major depressive disorder, recurrent, unspecified: Secondary | ICD-10-CM | POA: Diagnosis not present

## 2022-11-24 DIAGNOSIS — F411 Generalized anxiety disorder: Secondary | ICD-10-CM | POA: Diagnosis not present
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
## 2022-11-24 NOTE — Progress Notes (Signed)
Virtual Visit via Video Note   I connected with Ruth Taylor on 11/24/22 at  9:00 AM EDT by a video enabled telemedicine application and verified that I am speaking with the correct person using two identifiers.   At orientation to the IOP program, Case Manager discussed the limitations of evaluation and management by telemedicine and the availability of in person appointments. The patient expressed understanding and agreed to proceed with virtual visits throughout the duration of the program.   Location:  Patient: Patient Home Provider: [facility]   History of Present Illness: MDD   Observations/Objective: Check In: Case Manager checked in with all participants to review discharge dates, insurance authorizations, work-related documents and needs from the treatment team regarding medications. Ruth Taylor stated needs and engaged in discussion.    Initial Therapeutic Activity: Counselor facilitated a check-in with Ruth Taylor to assess for safety, sobriety and medication compliance.  Counselor also inquired about Ruth Taylor's current emotional ratings, as well as any significant changes in thoughts, feelings or behavior since previous check in.  Ruth Taylor presented for session on time and was alert, oriented x5, with no evidence or self-report of active SI/HI or A/V H.  Ruth Taylor reported compliance with medication and denied use of alcohol or illicit substances.  Ruth Taylor reported scores of 6/10 for depression, 7/10 for anxiety, and 7/10 for anger/irritability.  Ruth Taylor denied any recent outbursts or panic attacks.  Ruth Taylor reported that a recent success was waking up feeling a little calmer, stating "This is the first day of taking care of me".  Ruth Taylor reported that a struggle has been dealing with depression and anxiety, stating "There has been a storm of things that happened and blew up".  Ruth Taylor reported that her goal today is to get out of the house and take a walk.        Second Therapeutic Activity: Counselor introduced topic of  stress management today.  Counselor provided definition of stress as feeling tense, overwhelmed, worn out, and/or exhausted, and noted that in small amounts, stress can be motivating until things become too overwhelming to manage.  Counselor also explained how stress can be acute (brief but intense) or chronic (long-lasting) and this can impact the severity of symptoms one can experience in the physical, emotional, and behavioral categories.  Counselor inquired about members' specific stressors, how long they have been prevalent, and the various symptoms that tend to manifest as a result.  Counselor also offered several stress management strategies to help improve members' coping ability, including journaling, gratitude practice, relaxation techniques, and time management tips.  Counselor also explained that research has shown a strong support network composed of trusted family, friends, or community members can increase resilience in times of stress, and inquired about who members can reach out to for help in managing stressors.  Counselor encouraged members to consider discussing stressor 'red flags' with their close supports that can be monitored and strategies for assisting them in times of crisis.  Intervention was effective, as evidenced by Ruth Taylor actively participating in discussion on subject, reporting that her most significant stressors include changing jobs, keeping healthy, loneliness, money worries, world economy, pain and fatigue.  Ruth Taylor was able to identify several warning signs related to stress, including feeling restless, on edge, tearful or angry.  Ruth Taylor reported that her stress management goal is to prioritize self-care more often each day in order to avoid taking on too much stress from others in her support network.  Ruth Taylor also expressed receptiveness to several stress management strategies  practiced today in session, including practicing a deep breathing exercise, or using a tracker to monitor  daily stress levels.     Assessment and Plan: Counselor recommends that Ruth Taylor remain in IOP treatment to better manage mental health symptoms, ensure stability and pursue completion of treatment plan goals. Counselor recommends adherence to crisis/safety plan, taking medications as prescribed, and following up with medical professionals if any issues arise.    Follow Up Instructions: Counselor will send Webex link for session tomorrow.  Ruth Taylor was advised to call back or seek an in-person evaluation if the symptoms worsen or if the condition fails to improve as anticipated.   Collaboration of Care:   Medication Management AEB Dr. Cyndie Chime or Hillery Jacks, NP                                          Case Manager AEB Jeri Modena, CNA    Patient/Guardian was advised Release of Information must be obtained prior to any record release in order to collaborate their care with an outside provider. Patient/Guardian was advised if they have not already done so to contact the registration department to sign all necessary forms in order for [name] to release information regarding their care.    Consent: Patient/Guardian gives verbal consent for treatment and assignment of benefits for services provided during this visit. Patient/Guardian expressed understanding and agreed to proceed.   I provided 180 minutes of non-face-to-face time during this encounter.   [name], LCSW, LCAS 11/24/22

## 2022-11-25 ENCOUNTER — Other Ambulatory Visit (HOSPITAL_COMMUNITY): Payer: BC Managed Care – PPO | Admitting: Licensed Clinical Social Worker

## 2022-11-25 DIAGNOSIS — F331 Major depressive disorder, recurrent, moderate: Secondary | ICD-10-CM | POA: Diagnosis not present

## 2022-11-25 DIAGNOSIS — F411 Generalized anxiety disorder: Secondary | ICD-10-CM

## 2022-11-25 DIAGNOSIS — Z0000 Encounter for general adult medical examination without abnormal findings: Secondary | ICD-10-CM | POA: Diagnosis not present

## 2022-11-25 MED ORDER — HYDROXYZINE HCL 25 MG PO TABS: 25.0000 mg | ORAL_TABLET | Freq: Every evening | ORAL | 0 refills | Status: AC | PRN

## 2022-11-25 MED ORDER — HYDROXYZINE HCL 10 MG PO TABS: 10.0000 mg | ORAL_TABLET | Freq: Two times a day (BID) | ORAL | 0 refills | Status: AC | PRN

## 2022-11-25 MED ORDER — FLUOXETINE HCL 20 MG PO CAPS: 20.0000 mg | ORAL_CAPSULE | Freq: Every day | ORAL | 0 refills | Status: DC

## 2022-11-25 NOTE — Progress Notes (Signed)
Virtual Visit via Video Note   I connected with Tayden Duran on 11/25/22 at  9:00 AM EDT by a video enabled telemedicine application and verified that I am speaking with the correct person using two identifiers.   At orientation to the IOP program, Case Manager discussed the limitations of evaluation and management by telemedicine and the availability of in person appointments. The patient expressed understanding and agreed to proceed with virtual visits throughout the duration of the program.   Location:  Patient: [facility] Home [facility]: [facility]   History of Present Illness: MDD and GAD   Observations/Objective: Check In: Case Manager checked in with all participants to review discharge dates, insurance authorizations, work-related documents and needs from the treatment team regarding medications. Shaquetta stated needs and engaged in discussion.    Initial Therapeutic Activity: Counselor facilitated a check-in with Teana to assess for safety, sobriety and medication compliance.  Counselor also inquired about Zeriah's current emotional ratings, as well as any significant changes in thoughts, feelings or behavior since previous check in.  Sorcha presented for session on time and was alert, oriented x5, with no evidence or self-report of active SI/HI or A/V H.  Pegah reported compliance with medication and denied use of alcohol or illicit substances.  Jen reported scores of 5/10 for depression, 7/10 for anxiety, and 0/10 for anger/irritability.  Landyn denied any recent outbursts or panic attacks.  Carley reported that a recent success was getting outside the house and spending time in nature walking a trail and then hanging out with a friend.  Beatrix reported that a struggle was feeling 'wiped out' after these activities, and needing time to rest.  Gwendolynn reported that her goal today is to do some cleaning around her house.        Second Therapeutic Activity: Counselor introduced topic of assertive communication  today.  Counselor shared various handouts with members virtually in group to read along with on the subject.  These handouts defined assertive communication as a communication style in which a person stands up for their own needs and wants, while also taking into consideration the needs and wants of others, without behaving in a passive or aggressive way.  Traits of assertive communicators were highlighted such as using appropriate speaking volume, maintaining eye contact, using confident language, and avoiding interruption.  Members were also provided with tips on how to improve communication, including respecting oneself, expressing thoughts and feelings calmly, and saying "No" when necessary.  Members were given a variety of scenarios where they could practice using these tips to respond in an assertive manner.  Intervention was effective, as evidenced by [name] participating in discussion on topic, reporting that she has a passive communication style due to traits such as being taking advantage of by others, prioritizing the needs of others, speaking softly or keeping quiet to avoid 'drama', and not expressing her needs and wants.  Akila reported that being passive during interactions with others has led her to be taken advantage of by many people, and she has been trying to be more assertive, which has led several people  to cut contact.  She reported that this taught her more about who she can rely upon and trust, and stated "I'm on board with trying to communicate my needs now and I'm taking notice of people that don't have back".  Devone showed more effective use of assertive communication skills through engagement in roleplay activities.    Assessment and Plan: Counselor recommends that Alcie remain  in IOP treatment to better manage mental health symptoms, ensure stability and pursue completion of treatment plan goals. Counselor recommends adherence to crisis/safety plan, taking medications as prescribed, and  following up with medical professionals if any issues arise.    Follow Up Instructions: Counselor will send Webex link for session tomorrow.  Taffany was advised to call back or seek an in-person evaluation if the symptoms worsen or if the condition fails to improve as anticipated.   Collaboration of Care:   Medication Management AEB Dr. Cyndie Chime or Hillery Jacks, NP                                          Case Manager AEB Jeri Modena, CNA    Patient/Guardian was advised Release of Information must be obtained prior to any record release in order to collaborate their care with an outside provider. Patient/Guardian was advised if they have not already done so to contact the registration department to sign all necessary forms in order for [name] to release information regarding their care.    Consent: Patient/Guardian gives verbal consent for treatment and assignment of benefits for services provided during this visit. Patient/Guardian expressed understanding and agreed to proceed.   I provided 180 minutes of non-face-to-face time during this encounter.   [name], LCSW, LCAS 11/25/22

## 2022-11-26 ENCOUNTER — Other Ambulatory Visit (HOSPITAL_BASED_OUTPATIENT_CLINIC_OR_DEPARTMENT_OTHER): Payer: BC Managed Care – PPO | Admitting: Licensed Clinical Social Worker

## 2022-11-26 DIAGNOSIS — F411 Generalized anxiety disorder: Secondary | ICD-10-CM | POA: Diagnosis not present

## 2022-11-26 DIAGNOSIS — F331 Major depressive disorder, recurrent, moderate: Secondary | ICD-10-CM | POA: Diagnosis not present

## 2022-11-26 NOTE — Progress Notes (Signed)
Virtual Visit via Video Note  I connected with Ruth Taylor on 11/26/22 at  9:00 AM EDT by a video enabled telemedicine application and verified that I am speaking with the correct person using two identifiers.  At orientation to the IOP program, Case Manager?discussed the limitations of evaluation and management by telemedicine and the availability of in person appointments. The patient expressed understanding and agreed to proceed with virtual visits throughout the duration of the program.   Location: Patient: pt's home in [place], [place] Provider: clinical home office in [place], [place]   I discussed the limitations of evaluation and management by telemedicine and the availability of in person appointments. The patient expressed understanding and agreed to proceed.  History of Present Illness: MDD and GAD   Observations/Objective: Check In: Case Manager checked in with all participants to review discharge dates, insurance authorizations, work-related documents and needs from the treatment team regarding medications. Ruth Taylor stated needs and engaged in discussion.   ?  Initial Therapeutic Activity: Counselor facilitated a check-in with Ruth Taylor to assess for safety, sobriety and medication compliance.  Counselor also inquired about Ruth Taylor's current emotional ratings, as well as any significant changes in thoughts, feelings or behavior since previous check in.  Ruth Taylor presented for session on time and was alert, oriented x5, with no evidence or self-report of active SI/HI or A/V H.  Ruth Taylor reported compliance with medication and denied use of alcohol or illicit substances.  Ruth Taylor reported scores of 4/10 for depression, 6/10 for anxiety, and 0/10 for anger/irritability.  Ruth Taylor denied any recent outbursts or panic attacks.  Ruth Taylor reported that a recent success was filing for a reduction in paying child support.  Ruth Taylor reported that a struggle was feeling grief for her grandmother, whose birthday was yesterday.  Ruth Taylor  reported that her goal today is to get outside today.       ?  Second Therapeutic Activity: Counselor provided psychoeducation on traits of healthy relationships using a handout to guide discussion with members on the subject.  This handout explained how attachments to connections within one's support network (i.e. friends, family, romantic partners, coworkers, Catering manager) can directly influence one's mental health, and emphasized importance of looking for green lights (positive behaviors) that can be considered normal, as well as red lights (harmful behaviors) which suggest that a boundary should be established. Members were tasked with identifying green lights (i.e. respect, trust, appreciation, healthy conflict resolution, etc) and red lights (i.e. contempt, suspicion, impatience, lack of growth, etc) that have been present in past and present relationships, as well as strategies to assertively address these issues before the situation worsens.  Intervention was effective, as evidenced by Ruth Taylor actively participating in discussion on subject, and reporting that she recognizes times in which she engaged in behaviors that were red flags as well as others that she was in relationships with. She also noted that she strives to have more green lights in relationships and to exhibit more green light behaviors herself. She also verbalized support and expressed empathy for a peer.   Third Therapeutic Activity: Psycho-educational portion of group was provided by Virgina Evener, Interior and spatial designer of community education with [employer].  [name] provided information on history of her local agency, mission statement, and the variety of unique services offered which group members might find beneficial to engage in, including both virtual and in-person support groups, as well as peer support program for mentoring.  [name] offered time to answer member's questions regarding services and encouraged them to consider utilizing  these services to assist in working towards their individual wellness goals.  Intervention was effective, as evidenced by Ruth Taylor participating in discussion with speaker on the subject by actively listening to information that was presented.   Assessment and Plan: Counselor recommends that Ruth Taylor remain in IOP treatment to better manage mental health symptoms, ensure stability and pursue completion of treatment plan goals. Counselor recommends adherence to crisis/safety plan, taking medications as prescribed, and following up with medical professionals if any issues arise.    Follow Up Instructions: Counselor will send Webex link for session Monday.  Ruth Taylor was advised to call back or seek an in-person evaluation if the symptoms worsen or if the condition fails to improve as anticipated.    Collaboration of Care: Medication Management AEB Hillery Jacks NP and case manager Jeri Modena CNA  Patient/Guardian was advised Release of Information must be obtained prior to any record release in order to collaborate their care with an outside provider. Patient/Guardian was advised if they have not already done so to contact the registration department to sign all necessary forms in order for [name] to release information regarding their care.   Consent: Patient/Guardian gives verbal consent for treatment and assignment of benefits for services provided during this visit. Patient/Guardian expressed understanding and agreed to proceed.    I provided 180 minutes of non-face-to-face time during this encounter.   Diagnosis: MDD (major depressive disorder), recurrent episode, moderate (HCC)  GAD (generalized anxiety disorder)   Wyvonnia Lora, LCSW

## 2022-11-29 ENCOUNTER — Other Ambulatory Visit (HOSPITAL_COMMUNITY): Payer: BC Managed Care – PPO | Admitting: Licensed Clinical Social Worker

## 2022-11-29 DIAGNOSIS — Z0000 Encounter for general adult medical examination without abnormal findings: Secondary | ICD-10-CM | POA: Diagnosis not present

## 2022-11-29 DIAGNOSIS — F331 Major depressive disorder, recurrent, moderate: Secondary | ICD-10-CM

## 2022-11-29 DIAGNOSIS — F411 Generalized anxiety disorder: Secondary | ICD-10-CM

## 2022-11-30 ENCOUNTER — Telehealth (HOSPITAL_COMMUNITY): Payer: Self-pay | Admitting: Psychiatry

## 2022-11-30 ENCOUNTER — Other Ambulatory Visit (HOSPITAL_COMMUNITY): Payer: BC Managed Care – PPO | Admitting: Psychiatry

## 2022-12-01 ENCOUNTER — Telehealth (HOSPITAL_COMMUNITY): Payer: Self-pay | Admitting: Psychiatry

## 2022-12-01 ENCOUNTER — Other Ambulatory Visit (HOSPITAL_COMMUNITY): Payer: BC Managed Care – PPO | Admitting: Psychiatry

## 2022-12-01 DIAGNOSIS — F331 Major depressive disorder, recurrent, moderate: Secondary | ICD-10-CM

## 2022-12-01 DIAGNOSIS — F411 Generalized anxiety disorder: Secondary | ICD-10-CM | POA: Diagnosis not present

## 2022-12-01 DIAGNOSIS — Z0000 Encounter for general adult medical examination without abnormal findings: Secondary | ICD-10-CM | POA: Diagnosis not present
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
## 2022-12-01 NOTE — Progress Notes (Signed)
Virtual Visit via Video Note   I connected with Ruth Taylor on 12/01/22 at  9:00 AM EDT by a video enabled telemedicine application and verified that I am speaking with the correct person using two identifiers.   At orientation to the IOP program, Case Manager discussed the limitations of evaluation and management by telemedicine and the availability of in person appointments. The patient expressed understanding and agreed to proceed with virtual visits throughout the duration of the program.   Location:  Patient: Patient Home Provider: [facility]   History of Present Illness: MDD and GAD   Observations/Objective: Check In: Case Manager checked in with all participants to review discharge dates, insurance authorizations, work-related documents and needs from the treatment team regarding medications. Ruth Taylor stated needs and engaged in discussion.    Initial Therapeutic Activity: Counselor facilitated a check-in with Ruth Taylor to assess for safety, sobriety and medication compliance.  Counselor also inquired about Ruth Taylor's current emotional ratings, as well as any significant changes in thoughts, feelings or behavior since previous check in.  Ruth Taylor presented for session on time and was alert, oriented x5, with no evidence or self-report of active SI/HI or A/V H.  Ruth Taylor reported compliance with medication and denied use of alcohol or illicit substances.  Ruth Taylor reported scores of 4/10 for depression, 4/10 for anxiety, and 8/10 for anger/irritability.  Ruth Taylor denied any recent outbursts or panic attacks.  Ruth Taylor reported that a recent success was taking time to rest yesterday for self-care since she had a migraine the night before.  Ruth Taylor denied any new struggles.  Ruth Taylor reported that her goal today is to run some errands that she had been putting off.  Counselor heard Ruth Taylor, and observed her walking in a park afterward to eat it on a bench.  Counselor reminded Ruth Taylor about group expectations, including need  to be indoors, in a quiet space free of distractions, without ability for non-participants to overhear or view the group session due to risk of breaching confidentiality.  Ruth Taylor became upset, reported that she could not commit to this request, and wasn't sure if group therapy was what she needed after all.  Counselor asked if she would like to be excused today after attempts to de-escalate situation were unsuccessful.  Ruth Taylor reported that she would try to make it to the end of session.  Counselor informed case manager of this incident.  Counselor lost internet connection and had to switch offices at 10:10am, so case manager provided coverage for 15 minutes.               Second Therapeutic Activity: Counselor introduced Ruth Taylor, [employer] Pharmacist, to provide psychoeducation on topic of medication compliance with members today.  Ruth Taylor provided psychoeducation on classes of medications such as antidepressants, antipsychotics, what symptoms they are intended to treat, and any side effects one might encounter while on a particular prescription.  Time was allowed for clients to ask any questions they might have of Ruth Taylor regarding this specialty.  Intervention effectiveness could not be measured, as client did not participate.    Third Therapeutic Activity: Counselor introduced topic of grounding skills today.  Counselor defined these as simple strategies one can use to help detach from difficult thoughts or feelings temporarily by focusing on something else.  Counselor noted that grounding will not solve the problem at hand, but can provide the practitioner with time to regain control over their thoughts and/or feelings and prevent the situation from getting worse (i.e. interrupting a  panic attack).  Counselor divided these into three categories (mental, physical, and soothing) and then provided examples of each which group members could practice during session.  Some of these included describing one's environment in  detail or playing a categories game with oneself for mental category, taking a hot bath/shower, stretching, or carrying a grounding object for physical category, and saying kind statements, or visualizing people one cares about for soothing category.  Counselor inquired about which techniques members have used with success in the past, or will commit to learning, practicing, and applying now to improve coping abilities.  Intervention effectiveness was mixed, as Ruth Taylor was logged out of session from 11-11:40am.  Ruth Taylor reported when she returned that she needed time to calm down, apologized, and had noticeably improved mood afterward.  She participated in discussion during remaining time in session, and expressed interest in adding some grounding techniques to coping skills, such as using a worry stone as a grounding object, doing a relaxing yoga routine, drinking a soda and focusing on the taste and carbonation, practicing deep breathing, or saying kind statements to herself to challenge negative thinking.    Assessment and Plan: Counselor recommends that Ruth Taylor remain in IOP treatment to better manage mental health symptoms, ensure stability and pursue completion of treatment plan goals. Counselor recommends adherence to crisis/safety plan, taking medications as prescribed, and following up with medical professionals if any issues arise.    Follow Up Instructions: Counselor will send Webex link for session tomorrow.  Ruth Taylor was advised to call back or seek an in-person evaluation if the symptoms worsen or if the condition fails to improve as anticipated.   Collaboration of Care:   Medication Management AEB Dr. Cyndie Chime or Hillery Jacks, NP                                          Case Manager AEB Ruth Taylor Modena, CNA    Patient/Guardian was advised Release of Information must be obtained prior to any record release in order to collaborate their care with an outside provider. Patient/Guardian was advised if they have  not already done so to contact the registration department to sign all necessary forms in order for [name] to release information regarding their care.    Consent: Patient/Guardian gives verbal consent for treatment and assignment of benefits for services provided during this visit. Patient/Guardian expressed understanding and agreed to proceed.   I provided 140 minutes of non-face-to-face time during this encounter.   [name], LCSW, LCAS 12/01/22

## 2022-12-02 ENCOUNTER — Other Ambulatory Visit (HOSPITAL_COMMUNITY): Payer: BC Managed Care – PPO | Admitting: Psychiatry

## 2022-12-02 DIAGNOSIS — F331 Major depressive disorder, recurrent, moderate: Secondary | ICD-10-CM | POA: Diagnosis not present

## 2022-12-02 DIAGNOSIS — F411 Generalized anxiety disorder: Secondary | ICD-10-CM

## 2022-12-02 DIAGNOSIS — Z0000 Encounter for general adult medical examination without abnormal findings: Secondary | ICD-10-CM | POA: Diagnosis not present

## 2022-12-02 NOTE — Progress Notes (Signed)
Virtual Visit via Video Note   I connected with Ruth Taylor on 12/02/22 at  9:00 AM EDT by a video enabled telemedicine application and verified that I am speaking with the correct person using two identifiers.   At orientation to the IOP program, Case Manager discussed the limitations of evaluation and management by telemedicine and the availability of in person appointments. The patient expressed understanding and agreed to proceed with virtual visits throughout the duration of the program.   Location:  Patient: Patient Home Provider: [facility]   History of Present Illness: MDD and GAD   Observations/Objective: Check In: Case Manager checked in with all participants to review discharge dates, insurance authorizations, work-related documents and needs from the treatment team regarding medications. Ruth Taylor stated needs and engaged in discussion.    Initial Therapeutic Activity: Counselor facilitated a check-in with Ruth Taylor to assess for safety, sobriety and medication compliance.  Counselor also inquired about Ruth Taylor's current emotional ratings, as well as any significant changes in thoughts, feelings or behavior since previous check in.  Ruth Taylor presented for session on time and was alert, oriented x5, with no evidence or self-report of active SI/HI or A/V H.  Ruth Taylor reported compliance with medication and denied use of alcohol or illicit substances.  Ruth Taylor reported scores of 4/10 for depression, 4/10 for anxiety, and 2/10 for anger/irritability.  Ruth Taylor denied any recent outbursts or panic attacks.  Ruth Taylor reported that a recent success was taking care of some things in her house, and then taking a walk outside yesterday.  Ruth Taylor denied any new struggles.  Ruth Taylor reported that her goal today is to get outside again, and spend time with a friend that may visit for awhile.        Second Therapeutic Activity: Counselor introduced topic of self-care today.  Counselor explained how this can be defined as the things one  does to maintain good health and improve well-being.  Counselor provided members with a self-care assessment form to complete.  This handout featured various sub-categories of self-care, including physical, psychological/emotional, social, spiritual, and professional.  Members were asked to rank their engagement in the activities listed for each dimension on a scale of 1-3, with 1 indicating 'Poor', 2 indicating 'Ok', and 3 indicating 'Well'.  Counselor invited members to share results of their assessment, and inquired about which areas of self-care they are doing well in, as well as areas that require attention, and how they plan to begin addressing this during treatment.  Intervention was effective, as evidenced by Ruth Taylor successfully completing initial 2 sections of assessment and actively engaging in discussion on subject, reporting that she is excelling in areas such as participation in fun activities, taking vacations or daytrips, doing comforting things, and finding reasons to laugh, but would benefit from focusing more on areas such as getting enough sleep, attending preventative medical appointments, eating healthy foods, exercising, wearing clothes that make her feel good, taking time off from work, participating in hobbies, expressing feelings in healthy ways, and learning new things.  Ruth Taylor reported that she would work to improve self-care deficits by developing sleep rituals that help her fall asleep more easily at night, making appointments with a new PCP and cardiologist for a followup, adjusting diet to focus more on fruits and vegetables, keeping up with consistent walks and yoga practice to improve exercise, switching up her wardrobe to boost overall self-esteem, taking 'mental health days' off from her job to avoid overworking herself, exploring new and old hobbies that can  also ensure an outlet for stress such as baking, gardening, bird watching, foraging, writing in a journal, making art, bowling,  or joining a choir or theater.     Assessment and Plan: Counselor recommends that Ruth Taylor remain in IOP treatment to better manage mental health symptoms, ensure stability and pursue completion of treatment plan goals. Counselor recommends adherence to crisis/safety plan, taking medications as prescribed, and following up with medical professionals if any issues arise.    Follow Up Instructions: Counselor will send Webex link for session tomorrow.  Ruth Taylor was advised to call back or seek an in-person evaluation if the symptoms worsen or if the condition fails to improve as anticipated.   Collaboration of Care:   Medication Management AEB Dr. Cyndie Chime or Hillery Jacks, NP                                          Case Manager AEB Jeri Modena, CNA    Patient/Guardian was advised Release of Information must be obtained prior to any record release in order to collaborate their care with an outside provider. Patient/Guardian was advised if they have not already done so to contact the registration department to sign all necessary forms in order for [name] to release information regarding their care.    Consent: Patient/Guardian gives verbal consent for treatment and assignment of benefits for services provided during this visit. Patient/Guardian expressed understanding and agreed to proceed.   I provided 180 minutes of non-face-to-face time during this encounter.   [name], LCSW, LCAS 12/02/22

## 2022-12-03 ENCOUNTER — Other Ambulatory Visit (HOSPITAL_COMMUNITY): Payer: BC Managed Care – PPO | Admitting: Licensed Clinical Social Worker

## 2022-12-03 DIAGNOSIS — F411 Generalized anxiety disorder: Secondary | ICD-10-CM | POA: Diagnosis not present

## 2022-12-03 DIAGNOSIS — Z0000 Encounter for general adult medical examination without abnormal findings: Secondary | ICD-10-CM | POA: Diagnosis not present

## 2022-12-03 DIAGNOSIS — F331 Major depressive disorder, recurrent, moderate: Secondary | ICD-10-CM | POA: Diagnosis not present
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
## 2022-12-06 ENCOUNTER — Other Ambulatory Visit (HOSPITAL_COMMUNITY): Payer: BC Managed Care – PPO | Admitting: Licensed Clinical Social Worker

## 2022-12-06 DIAGNOSIS — F331 Major depressive disorder, recurrent, moderate: Secondary | ICD-10-CM | POA: Diagnosis not present

## 2022-12-06 DIAGNOSIS — F411 Generalized anxiety disorder: Secondary | ICD-10-CM

## 2022-12-06 DIAGNOSIS — Z0000 Encounter for general adult medical examination without abnormal findings: Secondary | ICD-10-CM | POA: Diagnosis not present

## 2022-12-06 NOTE — Progress Notes (Signed)
Virtual Visit via Video Note   I connected with Ruth Taylor on 12/06/22 at  9:00 AM EDT by a video enabled telemedicine application and verified that I am speaking with the correct person using two identifiers.   At orientation to the IOP program, Case Manager discussed the limitations of evaluation and management by telemedicine and the availability of in person appointments. The patient expressed understanding and agreed to proceed with virtual visits throughout the duration of the program.   Location:  Patient: Patient Home Provider: [facility]   History of Present Illness: MDD and GAD   Observations/Objective: Check In: Case Manager checked in with all participants to review discharge dates, insurance authorizations, work-related documents and needs from the treatment team regarding medications. Ruth Taylor stated needs and engaged in discussion.    Initial Therapeutic Activity: Counselor facilitated a check-in with Ruth Taylor to assess for safety, sobriety and medication compliance.  Counselor also inquired about Ruth Taylor's current emotional ratings, as well as any significant changes in thoughts, feelings or behavior since previous check in.  Ruth Taylor presented for session on time and was alert, oriented x5, with no evidence or self-report of active SI/HI or A/V H.  Ruth Taylor reported compliance with medication and denied use of alcohol or illicit substances.  Ruth Taylor reported scores of 4/10 for depression, 4/10 for anxiety, and 4/10 for anger/irritability.  Ruth Taylor denied any recent outbursts or panic attacks.  Ruth Taylor reported that a recent success was spending time with her daughter over the weekend, stating "We did some cooking and shopping together".  Ruth Taylor reported that a struggle was missing her daughter afterward when she had to go back home.  Morning reported that her goal today is to take the day to relax since she is feeling exhausted from the busy weekend.        Second Therapeutic Activity: Counselor introduced  topic of creating mental health maintenance plan today.  Counselor provided handout on subject to members, which stressed the importance of maintaining one's mental health in a similar way to using diet and exercise to ensure physical health.  Counselor walked members through process of identifying triggers which could worsen symptoms, including specific people, places, and things one needs to avoid.  Members were also tasked with identifying warning signs such as thoughts, feelings, or behaviors which could indicate mental health is at increased risk.  Counselor also facilitated conversation on self-care activities and coping strategies which members have previously utilized in the past, are currently using in daily routine, or plan to use soon to assist with managing problems or symptoms when/if they appear.  Counselor encouraged members to revisit their maintenance plan often and make changes as needed to ensure day to day stability.  Intervention was effective, as evidenced by Ruth Taylor participating in activity and creating a comprehensive plan, including identification of triggers such as feeling overstimulated or overwhelmed at her job or by demands from supports, having someone abruptly cancel plans, or being in loud or unruly crowds.  Ruth Taylor also reported that she would make an effort to set aside time for self-care activities such as cooking a nice meal, taking walks, taking a comforting bath, listening to calming music, taking a nap, and use coping skills such as meditation, or yoga to manage stressors.    Assessment and Plan: Counselor recommends that Ruth Taylor remain in IOP treatment to better manage mental health symptoms, ensure stability and pursue completion of treatment plan goals. Counselor recommends adherence to crisis/safety plan, taking medications as prescribed, and following  up with medical professionals if any issues arise.    Follow Up Instructions: Counselor will send Webex link for session  tomorrow.  Ruth Taylor was advised to call back or seek an in-person evaluation if the symptoms worsen or if the condition fails to improve as anticipated.   Collaboration of Care:   Medication Management AEB Dr. Alfonse Flavors or Hillery Jacks, NP                                          Case Manager AEB Jeri Modena, CNA    Patient/Guardian was advised Release of Information must be obtained prior to any record release in order to collaborate their care with an outside provider. Patient/Guardian was advised if they have not already done so to contact the registration department to sign all necessary forms in order for [name] to release information regarding their care.    Consent: Patient/Guardian gives verbal consent for treatment and assignment of benefits for services provided during this visit. Patient/Guardian expressed understanding and agreed to proceed.   I provided 180 minutes of non-face-to-face time during this encounter.   [name], LCSW, LCAS 12/06/22

## 2022-12-07 ENCOUNTER — Other Ambulatory Visit (HOSPITAL_COMMUNITY): Payer: BC Managed Care – PPO | Admitting: Licensed Clinical Social Worker

## 2022-12-07 DIAGNOSIS — F331 Major depressive disorder, recurrent, moderate: Secondary | ICD-10-CM

## 2022-12-07 DIAGNOSIS — Z0000 Encounter for general adult medical examination without abnormal findings: Secondary | ICD-10-CM | POA: Diagnosis not present

## 2022-12-07 DIAGNOSIS — F411 Generalized anxiety disorder: Secondary | ICD-10-CM

## 2022-12-07 NOTE — Progress Notes (Signed)
Virtual Visit via Video Note   I connected with Britlyn Martine on 12/07/22 at  9:00 AM EDT by a video enabled telemedicine application and verified that I am speaking with the correct person using two identifiers.   At orientation to the IOP program, Case Manager discussed the limitations of evaluation and management by telemedicine and the availability of in person appointments. The patient expressed understanding and agreed to proceed with virtual visits throughout the duration of the program.   Location:  Patient: Patient Home Provider: [facility]   History of Present Illness: MDD and GAD   Observations/Objective: Check In: Case Manager checked in with all participants to review discharge dates, insurance authorizations, work-related documents and needs from the treatment team regarding medications. Latorie stated needs and engaged in discussion.    Initial Therapeutic Activity: Counselor facilitated a check-in with Gaytha to assess for safety, sobriety and medication compliance.  Counselor also inquired about Ambreen's current emotional ratings, as well as any significant changes in thoughts, feelings or behavior since previous check in.  Valencia presented for session on time and was alert, oriented x5, with no evidence or self-report of active SI/HI or A/V H.  Odetta reported compliance with medication and denied use of alcohol or illicit substances.  Rosaly reported scores of 5/10 for depression, 3/10 for anxiety, and 0/10 for anger/irritability.  Enez denied any recent outbursts or panic attacks.  Annalisia reported that a recent success was taking time to rest yesterday since she felt "Wiped out" after group.  Leba reported that a struggle is worrying about her daughter, who was upset yesterday about a friend having to move out of the area.  Zarya reported that her goal today is to do something for self-care, such as going out with a friend for a walk.        Second Therapeutic Activity: Counselor introduced  [place], [place] Chaplain to provide psychoeducation on topic of Grief and Loss with members today.  Marchelle Folks began discussion by checking in with the group about their baseline mood today, general thoughts on what grief means to them and how it has affected them personally in the past.  Marchelle Folks provided information on how the process of grief/loss can differ depending upon one's unique culture, and categories of loss one could experience (i.e. loss of a person, animal, relationship, job, identity, etc).  Marchelle Folks encouraged members to be mindful of how pervasive loss can be, and how to recognize signs which could indicate that this is having an impact on one's overall mental health and wellbeing.  Intervention was effective, as evidenced by [name] participating in discussion with speaker on the subject, reporting that this subject makes her reflect upon the difficult loss of her grandmother, who had dementia, and Marielle cared for in the final stages of her life.  Aliea reported that she passed away 6 months ago, and this is compounded by the loss of time with her children, who she was isolated from for several years.  Krissi stated "Its hard when you don't have a support system".  Nolita reported that taking daycations to places in nature that are beautiful has been healing at times.    Third Therapeutic Activity: Counselor provided demonstration of relaxation technique known as mindful breathing meditation to help members increase sense of calm, resiliency, and control.  Counselor guided members through process of getting comfortable, achieving a relaxed breathing rhythm, and focusing on this for several minutes, allowing troubling thoughts and feelings to come and  go without rumination.  Counselor processed effectiveness of activity afterward in discussion with members, including how this impacted their mental state, whether it was difficult to stay focused, and if they plan to include it in self-care routine to  improve day-to-day coping.  Intervention was effective, as evidenced by [name] participating in exercise, and reporting that she has been meditating for some time, and found this to be good reinforcement for her own practice.  Jayanna stated "It can be hard to clear my mind sometimes, but the breathing keeps me focused".      Assessment and Plan: Counselor recommends that Breonia remain in IOP treatment to better manage mental health symptoms, ensure stability and pursue completion of treatment plan goals. Counselor recommends adherence to crisis/safety plan, taking medications as prescribed, and following up with medical professionals if any issues arise.    Follow Up Instructions: Counselor will send Webex link for session tomorrow.  Kevia was advised to call back or seek an in-person evaluation if the symptoms worsen or if the condition fails to improve as anticipated.   Collaboration of Care:   Medication Management AEB Dr. Alfonse Flavors or Hillery Jacks, NP                                          Case Manager AEB Jeri Modena, CNA    Patient/Guardian was advised Release of Information must be obtained prior to any record release in order to collaborate their care with an outside provider. Patient/Guardian was advised if they have not already done so to contact the registration department to sign all necessary forms in order for [name] to release information regarding their care.    Consent: Patient/Guardian gives verbal consent for treatment and assignment of benefits for services provided during this visit. Patient/Guardian expressed understanding and agreed to proceed.   I provided 180 minutes of non-face-to-face time during this encounter.   [name], LCSW, LCAS 12/07/22

## 2022-12-08 ENCOUNTER — Other Ambulatory Visit (HOSPITAL_COMMUNITY): Payer: BC Managed Care – PPO | Admitting: Licensed Clinical Social Worker

## 2022-12-08 DIAGNOSIS — F331 Major depressive disorder, recurrent, moderate: Secondary | ICD-10-CM

## 2022-12-08 DIAGNOSIS — F411 Generalized anxiety disorder: Secondary | ICD-10-CM

## 2022-12-08 DIAGNOSIS — Z0000 Encounter for general adult medical examination without abnormal findings: Secondary | ICD-10-CM | POA: Diagnosis not present

## 2022-12-08 NOTE — Progress Notes (Signed)
Virtual Visit via Video Note   I connected with Krystalle Pilkington on 12/08/22 at  9:00 AM EDT by a video enabled telemedicine application and verified that I am speaking with the correct person using two identifiers.   At orientation to the IOP program, Case Manager discussed the limitations of evaluation and management by telemedicine and the availability of in person appointments. The patient expressed understanding and agreed to proceed with virtual visits throughout the duration of the program.   Location:  Patient: [facility] Home Provider: [facility]   History of Present Illness: MDD and GAD   Observations/Objective: Check In: Case Manager checked in with all participants to review discharge dates, insurance authorizations, work-related documents and needs from the treatment team regarding medications. Dorina stated needs and engaged in discussion.    Initial Therapeutic Activity: Counselor facilitated a check-in with Valen to assess for safety, sobriety and medication compliance.  Counselor also inquired about Tyeisha's current emotional ratings, as well as any significant changes in thoughts, feelings or behavior since previous check in.  Amariah presented for session on time and was alert, oriented x5, with no evidence or self-report of active SI/HI or A/V H.  Monserrat reported compliance with medication and denied use of alcohol or illicit substances.  Yevonne reported scores of 4/10 for depression, 3/10 for anxiety, and 0/10 for anger/irritability.  Bruna denied any recent outbursts or panic attacks.  Shatarra reported that a recent success was going out to lunch with a friend, and then inviting them to sleep over, stating "We had a good time hanging out".  Manaia denied any new struggles.  Jariya reported that her goal today is to get some work done on her car.        Second Therapeutic Activity: Counselor offered to teach group members an ACT relaxation technique today to aid in managing difficult thoughts,  feelings, urges, and sensations.  Counselor guided members through process of getting comfortable, achieving relaxing breathing rhythm, and then maintaining this throughout activity.  Counselor invited members to imagine a gently flowing stream in their mind with leaves floating upon it, and when any thoughts, feelings, urges, or sensations arose, good or bad, they were instructed to visualize placing them on these passing leaves over course of 15 minutes practice.  Intervention was effective, as evidenced by [name] successfully participating in activity and reporting that she found it relaxing overall, despite some trouble concentrating at times.  She stated "I'm good at imagining nature. I love it.  I look at streams a whole lot".    Third Therapeutic Activity: Counselor introduced Celine Mans, [employer], to provide psychoeducation on topic of nutrition with members today.  Jae Dire virtually shared a comprehensive PowerPoint presentation to guide discussion, which featured the various components of healthy living that influence one's wellbeing, including practicing mindfulness, staying physically active, calm in mood, well-rested, and more.  Jae Dire explained how good nutrition reinforces positive physical and mental health, and shared a video which explained how food intake affects brain functioning in particular.  Jae Dire provided advice on how to adjust diet in order to promote wellbeing during course of treatment, including achieving balanced daily intake along with regular exercise.  Jae Dire offered the 'plate method' as a tool for proper distribution of protein, grains, starches, vegetables, fruit, and low calorie drink, in addition to concept of 'mindful eating', and covering current Dietary Guidelines for Americans for more tips.  Jae Dire inquired about changes members would like to make to their nutrition in order  to increase overall wellbeing based upon information shared today, and time was allowed to ask any  questions they might have of Jae Dire regarding her specialty.  Intervention was effective, as evidenced by [name] participating in discussion with speaker on the subject, reporting that she tries to maintain a healthy diet, and an environment at home that reinforces this.  Antoinett reported that she would like to practice mindful eating as a strategy to avoid compulsive eating, and was also observed participating in a stretch break guided by speaker.    Assessment and Plan: Counselor recommends that Neomia remain in IOP treatment to better manage mental health symptoms, ensure stability and pursue completion of treatment plan goals. Counselor recommends adherence to crisis/safety plan, taking medications as prescribed, and following up with medical professionals if any issues arise.    Follow Up Instructions: Counselor will send Webex link for session tomorrow.  Remmi was advised to call back or seek an in-person evaluation if the symptoms worsen or if the condition fails to improve as anticipated.   Collaboration of Care:   Medication Management AEB Dr. Alfonse Flavors or Hillery Jacks, NP                                          Case Manager AEB Jeri Modena, CNA    Patient/Guardian was advised Release of Information must be obtained prior to any record release in order to collaborate their care with an outside provider. Patient/Guardian was advised if they have not already done so to contact the registration department to sign all necessary forms in order for [name] to release information regarding their care.    Consent: Patient/Guardian gives verbal consent for treatment and assignment of benefits for services provided during this visit. Patient/Guardian expressed understanding and agreed to proceed.   I provided 180 minutes of non-face-to-face time during this encounter.   [name], LCSW, LCAS 12/08/22

## 2022-12-09 ENCOUNTER — Other Ambulatory Visit (HOSPITAL_COMMUNITY): Payer: BC Managed Care – PPO | Admitting: Psychiatry

## 2022-12-09 DIAGNOSIS — F331 Major depressive disorder, recurrent, moderate: Secondary | ICD-10-CM

## 2022-12-09 DIAGNOSIS — Z0000 Encounter for general adult medical examination without abnormal findings: Secondary | ICD-10-CM | POA: Diagnosis not present

## 2022-12-09 DIAGNOSIS — F411 Generalized anxiety disorder: Secondary | ICD-10-CM

## 2022-12-09 NOTE — Progress Notes (Signed)
Virtual Visit via Video Note   I connected with Ruth Taylor on 12/09/22 at  9:00 AM EDT by a video enabled telemedicine application and verified that I am speaking with the correct person using two identifiers.   At orientation to the IOP program, Case Manager discussed the limitations of evaluation and management by telemedicine and the availability of in person appointments. The patient expressed understanding and agreed to proceed with virtual visits throughout the duration of the program.   Location:  Patient: Patient Home Provider: Home Office   History of Present Illness: MDD and GAD   Observations/Objective: Check In: Case Manager checked in with all participants to review discharge dates, insurance authorizations, work-related documents and needs from the treatment team regarding medications. Ruth Taylor stated needs and engaged in discussion.    Initial Therapeutic Activity: Counselor facilitated a check-in with Ruth Taylor to assess for safety, sobriety and medication compliance.  Counselor also inquired about Ruth Taylor's current emotional ratings, as well as any significant changes in thoughts, feelings or behavior since previous check in.  Ruth Taylor presented for session on time and was alert, oriented x5, with no evidence or self-report of active SI/HI or A/V H.  Ruth Taylor reported compliance with medication and denied use of alcohol or illicit substances.  Ruth Taylor reported scores of 2/10 for depression, 3/10 for anxiety, and 0/10 for anger/irritability.  Ruth Taylor denied any recent outbursts or panic attacks.  Ruth Taylor reported that a recent success was going to a [employer] and [place] to get new clothing.  Ruth Taylor denied any new struggles at this time.  Ruth Taylor reported that her goal today is to do some cleaning around the home to be productive and then get her hair done afterward.          Second Therapeutic Activity: Counselor introduced topic of stress management today.  Counselor provided definition of stress as feeling  tense, overwhelmed, worn out, and/or exhausted, and noted that in small amounts, stress can be motivating until things become too overwhelming to manage.  Counselor also explained how stress can be acute (brief but intense) or chronic (long-lasting) and this can impact the severity of symptoms one can experience in the physical, emotional, and behavioral categories.  Counselor inquired about members' specific stressors, how long they have been prevalent, and the various symptoms that tend to manifest as a result.  Counselor also offered several stress management strategies to help improve members' coping ability, including journaling, gratitude practice, relaxation techniques, and time management tips.  Counselor also explained that research has shown a strong support network composed of trusted family, friends, or community members can increase resilience in times of stress, and inquired about who members can reach out to for help in managing stressors.  Counselor encouraged members to consider discussing stressor 'red flags' with their close supports that can be monitored and strategies for assisting them in times of crisis.  Intervention was effective, as evidenced by Ruth Taylor actively participating in discussion on subject, reporting that one of her biggest stressors recently has been dealing with her job, and this led her to seek treatment for help.   Ruth Taylor was able to identify several warning signs related to stress, including muscle twitching, heart palpitations, and irritability.  Ruth Taylor reported that her stress management goal is to take a lunch break every day and use this time to practice more relaxation techniques to stay calm at work.  Ruth Taylor also expressed receptiveness to several stress management strategies practiced today in session, including meditation, deep breathing, using a  stress tracker in her journal to better understand triggers and alternative coping skills to practice, and progressive muscle  relaxation.  Ruth Taylor stated "My doctor said that I literally live in 'fight or flight' and I would have a stroke if I didn't take better care of myself.  This is my whole reason for being here".     Assessment and Plan: Counselor recommends that Ruth Taylor remain in IOP treatment to better manage mental health symptoms, ensure stability and pursue completion of treatment plan goals. Counselor recommends adherence to crisis/safety plan, taking medications as prescribed, and following up with medical professionals if any issues arise.    Follow Up Instructions: Counselor will send Webex link for session tomorrow.  Ruth Taylor was advised to call back or seek an in-person evaluation if the symptoms worsen or if the condition fails to improve as anticipated.   Collaboration of Care:   Medication Management AEB Dr. Alfonse Taylor or Ruth Jacks, NP                                          Case Manager AEB Ruth Modena, CNA    Patient/Guardian was advised Release of Information must be obtained prior to any record release in order to collaborate their care with an outside provider. Patient/Guardian was advised if they have not already done so to contact the registration department to sign all necessary forms in order for [name] to release information regarding their care.    Consent: Patient/Guardian gives verbal consent for treatment and assignment of benefits for services provided during this visit. Patient/Guardian expressed understanding and agreed to proceed.   I provided 180 minutes of non-face-to-face time during this encounter.   Ruth Stain, LCSW, LCAS 12/09/22

## 2022-12-10 ENCOUNTER — Telehealth (HOSPITAL_COMMUNITY): Payer: Self-pay | Admitting: Psychiatry

## 2022-12-10 ENCOUNTER — Other Ambulatory Visit (HOSPITAL_COMMUNITY): Payer: BC Managed Care – PPO | Admitting: Psychiatry

## 2022-12-13 ENCOUNTER — Other Ambulatory Visit (HOSPITAL_COMMUNITY): Payer: BC Managed Care – PPO | Admitting: Licensed Clinical Social Worker

## 2022-12-13 DIAGNOSIS — F331 Major depressive disorder, recurrent, moderate: Secondary | ICD-10-CM

## 2022-12-13 DIAGNOSIS — F411 Generalized anxiety disorder: Secondary | ICD-10-CM

## 2022-12-13 DIAGNOSIS — Z0000 Encounter for general adult medical examination without abnormal findings: Secondary | ICD-10-CM | POA: Diagnosis not present
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
## 2022-12-14 ENCOUNTER — Other Ambulatory Visit (HOSPITAL_COMMUNITY): Payer: BC Managed Care – PPO | Admitting: Psychiatry

## 2022-12-14 ENCOUNTER — Encounter (HOSPITAL_COMMUNITY): Payer: Self-pay | Admitting: Psychiatry

## 2022-12-14 DIAGNOSIS — Z0000 Encounter for general adult medical examination without abnormal findings: Secondary | ICD-10-CM | POA: Diagnosis not present

## 2022-12-14 DIAGNOSIS — F411 Generalized anxiety disorder: Secondary | ICD-10-CM | POA: Diagnosis not present

## 2022-12-14 DIAGNOSIS — F331 Major depressive disorder, recurrent, moderate: Secondary | ICD-10-CM

## 2022-12-14 MED ORDER — FLUOXETINE HCL 40 MG PO CAPS
40.0000 mg | ORAL_CAPSULE | Freq: Every day | ORAL | 2 refills | Status: AC
Start: 2022-12-14 — End: ?

## 2022-12-14 NOTE — Progress Notes (Signed)
Virtual Visit via Video Note  I connected with Ruth Taylor on @TODAY @ at  9:00 AM EDT by a video enabled telemedicine application and verified that I am speaking with the correct person using two identifiers.  Location: Patient: at home Provider: at office   I discussed the limitations of evaluation and management by telemedicine and the availability of in person appointments. The patient expressed understanding and agreed to proceed.  I discussed the assessment and treatment plan with the patient. The patient was provided an opportunity to ask questions and all were answered. The patient agreed with the plan and demonstrated an understanding of the instructions.   The patient was advised to call back or seek an in-person evaluation if the symptoms worsen or if the condition fails to improve as anticipated.  I provided 30 minutes of non-face-to-face time during this encounter.   Jeri Modena, M.Ed,CNA   Patient ID: Ruth Taylor, female   DOB: 07/30/1980, 42 y.o.   MRN: [id number] D: This is a 42 yr old, divorced, employed Caucasian female who was referred per [facility]; treatment for worsening anxiety and depressive sx's.  Denies SI/HI or A/V hallucinations.  Stressors:  1) Medical Issues:  Graves Disease, Heart Issues and Hyperthyroidism  2) Kids: 78 yr old daughter and 68 yr old son according to pt; both were in a cult in which pt's mother-in-law belonged.  "That cult has been made into a [employer] special and it has traumatized both my kids."  Pt states her daughter is currently in a controlling relationship and is pregnant.  According to the pt, daughter is involved with someone who comes from wealth; "just like the person who was over the cult."  Reports both kids been to 8 counselors since the documentary.  Pt states she has been in custody court for three yrs b/c the judge keeps continuing the case.3) Unresolved grief/loss issues:  13-May-2022 MGM died.  Two days after her b-day.  She had dementia and  pt had been her caregiver for three yrs.  4) Job of 6 yrs.  Pt states the job is very demanding and busy, no breaks.  Works with a  lot of truckers.  5) Limited support.  Pt denies psych admit or self injurious behaviors.   Pt denies having a therapist and psychiatrist.  Admits to attending support groups via [facility].  States she has been tried on Wellbutrin, Effexor, Prozac and Lexapro.  "Prozac is the only one that was working for me but it was a very low dose."  States she doesn't have a PCP d/t being let go from the practice d/t missed appts.  Family hx:  Deceased MGM (depression/anxiety)  Pt attended thirteen days in virtual MH-IOP.  Reports feeling "a little better."  According to pt, although her medications are working, she would like to go up on her Prozac by 20 mg.  "That's what I am used to being on and it always worked for me."  Reports not having to take as much of her anti-anxiety medication.  States that the groups were helpful once she settled in.  "I needed the groups.  I need to work on sleep hygiene techniques.  I plan to follow up with [facility] and [facility]." On a scale of 1-10 (10 being the worst); pt rates her anxiety at a 3 and depression at a 3.  Denies SI/HI or A/V hallucinations. Pt will RTW on 12-27-22.  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 A:  Discharge pt today.  Follow up with Hillery Jacks, NP on 12-21-22 @ 3pm (in person) and Cyril Loosen, LCSW on 12-22-22 @ 8am (in person).  RTW on 12-27-22, without any restrictions.  Strongly recommend f/u with [facility] and [facility].  Pt was advised of ROI must be obtained prior to any records release in order to collaborate her care with an outside provider.  Pt was advised if she has not already done so to contact the front desk to sign all necessary forms in order for MH-IOP to release info re: her care.  Consent:  Pt gives verbal  consent for tx and assignment of benefits for services provided during this telehealth group process.  Pt expressed understanding and agreed to proceed. Collaboration of care:  Collaborate with Dr. Lamar Sprinkles AEB, Hillery Jacks, NP AEB; [name], LCSW AEB, Cyril Loosen, LCSW AEB.  R:  Pt receptive.  Jeri Modena, M.Ed,CNA

## 2022-12-14 NOTE — Progress Notes (Signed)
Virtual Visit via Telephone Note  I connected with Ruth Taylor on 12/14/22 at  9:00 AM EDT by telephone and verified that I am speaking with the correct person using two identifiers.  Location: Patient: Home Provider: Office    I discussed the limitations, risks, security and privacy concerns of performing an evaluation and management service by telephone and the availability of in person appointments. I also discussed with the patient that there may be a patient responsible charge related to this service. The patient expressed understanding and agreed to proceed.   I discussed the assessment and treatment plan with the patient. The patient was provided an opportunity to ask questions and all were answered. The patient agreed with the plan and demonstrated an understanding of the instructions.   The patient was advised to call back or seek an in-person evaluation if the symptoms worsen or if the condition fails to improve as anticipated.  I provided 15 minutes of non-face-to-face time during this encounter.   Ruth Rack, NP   [facility] Intensive [facility] Discharge Summary  Ruth Taylor [id number]  Admission date: 11/16/2022 Discharge date: 12/14/2022  Reason for admission: Per admission assessment note: "Ruth Taylor is a 42 y.o. female with a history of GAD, MDD, PTSD who presents in person to [facility] for initial evaluation in Intensive Outpatient Program.  Patient reports that she has ongoing stressors that have worsened her depression and anxiety over the past 6 months. At this time, she meets criteria for MDD and GAD. Despite trauma hx, no PTSD sx reported. The only medication that has worked well in the past was Prozac. As well, hydorxyzine is the only PRN medication approved by her previous PCP with her autoimmune hx. "   Progress in Program Toward Treatment Goals: Progressing patient attended and participated with daily group  sessions with active and engaged participation.  Denying suicidal or homicidal ideations at discharge.  Rates her depression and anxiety 4 out of 10 with 10 being the worst.  Has a request to increased her Prozac from 20 mg to 40 mg as she states she was previously on that dose by her primary care provider.  Reports overall her mood has improved/stabilized.  Patient has out patient follow-up scheduled with this provider.  Will make medications adjustments accordingly.  Education provided with symptoms related to nausea vomiting and/or dizziness.  Patient encouraged to take with food and/or at nighttime.  She was receptive to plan.  Progress (rationale): Keep outpatient follow-up with Ruth Loosen LCSW 12/22/2022 on and this provider and NP Ruth Taylor-12/21/2022 @ 1500  Collaboration of Care: Medication Management AEB increase Prozac 20 mg to 40 mg daily  Patient/Guardian was advised Release of Information must be obtained prior to any record release in order to collaborate their care with an outside provider. Patient/Guardian was advised if they have not already done so to contact the registration department to sign all necessary forms in order for [name] to release information regarding their care.   Consent: Patient/Guardian gives verbal consent for treatment and assignment of benefits for services provided during this visit. Patient/Guardian expressed understanding and agreed to proceed.    Ruth Jacks NP  12/14/2022

## 2022-12-14 NOTE — Progress Notes (Signed)
Virtual Visit via Video Note   I connected with Ruth Taylor on 12/14/22 at  9:00 AM EDT by a video enabled telemedicine application and verified that I am speaking with the correct person using two identifiers.   At orientation to the IOP program, Case Manager discussed the limitations of evaluation and management by telemedicine and the availability of in person appointments. The patient expressed understanding and agreed to proceed with virtual visits throughout the duration of the program.   Location:  Patient: Patient Home Provider: OPT BH Office   History of Present Illness: MDD and GAD   Observations/Objective: Check In: Case Manager checked in with all participants to review discharge dates, insurance authorizations, work-related documents and needs from the treatment team regarding medications. Ruth Taylor stated needs and engaged in discussion.    Initial Therapeutic Activity: Counselor facilitated a check-in with Ruth Taylor to assess for safety, sobriety and medication compliance.  Counselor also inquired about Ruth Taylor's current emotional ratings, as well as any significant changes in thoughts, feelings or behavior since previous check in.  Ruth Taylor presented for session on time and was alert, oriented x5, with no evidence or self-report of active SI/HI or A/V H.  Ruth Taylor reported compliance with medication and denied use of alcohol or illicit substances.  Ruth Taylor reported scores of 3/10 for depression, 0/10 for anxiety, and 0/10 for anger/irritability.  Ruth Taylor denied any recent outbursts or panic attacks.  Ruth Taylor reported that a recent success was getting outside in her yard yesterday reading and walking for self-care.  Ruth Taylor denied any new struggles.  Ruth Taylor reported that her goal today is to take a walk in the park since the weather will be nice.        Second Therapeutic Activity: Counselor introduced topic of building a social support network today.  Counselor explained how this can be defined as having a having a  group of healthy people in one's life you can talk to, spend time with, and get help from to improve both mental and physical health.  Counselor noted that some barriers can make it difficult to connect with other people, including the presence of anxiety or depression, or moving to an unfamiliar area.  Group members were asked to assess the current state of their support network, and identify ways that this could be improved.  Tips were given on how to address previously noted barriers, such as strengthening social skills, using relaxation techniques to reduce anxiety, scheduling social time each week, and/or exploring social events nearby which could increase chances of meeting new supports.  Members were also encouraged to consider getting closer to people they already know through suggestions such as outreaching someone by text, email or phone call if they haven't spoken in awhile, doing something nice for a friend/family member unexpectedly, and/or inviting someone over for a game/movie/dinner night.  Intervention was effective, as evidenced by Ruth Taylor actively participating in discussion on the subject, and reporting that she is motivated to expand her network, as she has been in this area for several years, but not taken advantage of many social events to meet people.  Ruth Taylor stated "I want to meet people that have the same interests and will go on adventures with me".  She reported that her goal will be to plan to attend community events going on this month to reduce isolation, such as haunted houses, fall festivals, and consider joining groups such as theater, choir, or yoga.     Assessment and Plan: Ruth Taylor has completed MHIOP and will  be discharged today. Counselor recommends adherence to crisis/safety plan, taking medications as prescribed, and following up with medical professionals if any issues arise.    Follow Up Instructions: Ruth Taylor was advised to call back or seek an in-person evaluation if the  symptoms worsen or if the condition fails to improve as anticipated.   Collaboration of Care:   Medication Management AEB Dr. Alfonse Flavors or Hillery Jacks, NP                                          Case Manager AEB Jeri Modena, CNA    Patient/Guardian was advised Release of Information must be obtained prior to any record release in order to collaborate their care with an outside provider. Patient/Guardian was advised if they have not already done so to contact the registration department to sign all necessary forms in order for [name] to release information regarding their care.    Consent: Patient/Guardian gives verbal consent for treatment and assignment of benefits for services provided during this visit. Patient/Guardian expressed understanding and agreed to proceed.   I provided 180 minutes of non-face-to-face time during this encounter.   Ruth Taylor, [place], LCAS 12/14/22

## 2022-12-14 NOTE — Patient Instructions (Signed)
D:  Patient successfully completed virtual MH-IOP today.  A:  Discharge today.  Follow up with Hillery Jacks, NP on 12-21-22 @ 3pm (in person) and Cyril Loosen, LCSW on 12-22-22 @ 8 a.m. (in person).  Address:  [address], [place], [place]  [address].  Strongly encouraged support groups through [facility] and [facility].  Return to work on 12-27-22, without any restrictions.  R:  Patient receptive.

## 2022-12-15 ENCOUNTER — Other Ambulatory Visit (HOSPITAL_COMMUNITY): Payer: BC Managed Care – PPO

## 2022-12-16 ENCOUNTER — Other Ambulatory Visit (HOSPITAL_COMMUNITY): Payer: BC Managed Care – PPO

## 2022-12-17 ENCOUNTER — Other Ambulatory Visit (HOSPITAL_COMMUNITY): Payer: BC Managed Care – PPO

## 2022-12-20 ENCOUNTER — Other Ambulatory Visit (HOSPITAL_COMMUNITY): Payer: BC Managed Care – PPO

## 2022-12-21 ENCOUNTER — Encounter (HOSPITAL_COMMUNITY): Payer: Self-pay | Admitting: Family

## 2022-12-21 ENCOUNTER — Ambulatory Visit (HOSPITAL_BASED_OUTPATIENT_CLINIC_OR_DEPARTMENT_OTHER): Payer: BC Managed Care – PPO | Admitting: Family

## 2022-12-21 ENCOUNTER — Other Ambulatory Visit (HOSPITAL_COMMUNITY): Payer: BC Managed Care – PPO

## 2022-12-21 VITALS — BP 130/88 | HR 88 | Temp 98.5°F | Resp 18

## 2022-12-21 DIAGNOSIS — F411 Generalized anxiety disorder: Secondary | ICD-10-CM

## 2022-12-21 DIAGNOSIS — F331 Major depressive disorder, recurrent, moderate: Secondary | ICD-10-CM
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
## 2022-12-22 ENCOUNTER — Other Ambulatory Visit (HOSPITAL_COMMUNITY): Payer: BC Managed Care – PPO

## 2022-12-22 ENCOUNTER — Encounter (HOSPITAL_COMMUNITY): Payer: Self-pay

## 2022-12-22 ENCOUNTER — Ambulatory Visit (INDEPENDENT_AMBULATORY_CARE_PROVIDER_SITE_OTHER): Payer: BC Managed Care – PPO | Admitting: Licensed Clinical Social Worker

## 2022-12-22 DIAGNOSIS — F331 Major depressive disorder, recurrent, moderate: Secondary | ICD-10-CM | POA: Diagnosis not present

## 2022-12-22 DIAGNOSIS — F411 Generalized anxiety disorder: Secondary | ICD-10-CM | POA: Diagnosis not present

## 2022-12-22 NOTE — Progress Notes (Signed)
  THERAPIST PROGRESS NOTE   Session Date: 12/22/2022  Session Time: 0813 - 0907  Participation Level: Active  Behavioral Response: Neat and Well GroomedAlertAnxious and Depressed Tearful  Type of Therapy: Individual Therapy  Treatment Goals addressed: N/A. Clinician and pt met in order to develop new tx plan and identify goals  specific to dx and presenting sxs.  ProgressTowards Goals: Initial  Interventions: Motivational Interviewing, Solution Focused, Strength-based, and Supportive  Summary: [name] is a 42 y.o. female who presents with past psych hx of MDD and GAD. Pt has prior IOP tx with [facility] and referred to establish individual therapy and continued med man with Hillery Jacks, NP as most appropriate step-down LOC. Patient engaged in session, sharing hx of past trauma and current presenting stressors contributing to depression and anxiety. Patient continues to meet criteria for MDD and GAD. Patient will continue in outpatient therapy due to being the least restrictive service to meet her needs.  Suicidal/Homicidal: No  Therapist Response: Therapist utilized CBT, MI, Solution Focused and strength-based tx approaches to address presenting problems leading pt to pursue tx and establish OPT services and exploration of individualized tx goals. Therapist provided support and empathy to patient during session.  Plan: Return again in 1 weeks.  Diagnosis: MDD (major depressive disorder), recurrent episode, moderate (HCC)  GAD (generalized anxiety disorder)   Collaboration of Care: Other None necessary at this time.  Patient/Guardian was advised Release of Information must be obtained prior to any record release in order to collaborate their care with an outside provider. Patient/Guardian was advised if they have not already done so to contact the registration department to sign all necessary forms in order for [name] to release information regarding their care.   Consent:  Patient/Guardian gives verbal consent for treatment and assignment of benefits for services provided during this visit. Patient/Guardian expressed understanding and agreed to proceed.   [name] 12/22/2022,  9:20 AM

## 2022-12-23 ENCOUNTER — Other Ambulatory Visit (HOSPITAL_COMMUNITY): Payer: BC Managed Care – PPO

## 2022-12-23 ENCOUNTER — Telehealth (HOSPITAL_COMMUNITY): Payer: Self-pay | Admitting: Psychiatry

## 2022-12-23 ENCOUNTER — Other Ambulatory Visit (HOSPITAL_COMMUNITY): Payer: Self-pay | Admitting: Student

## 2022-12-23 NOTE — Telephone Encounter (Signed)
D:  Patient called and sent email re: requesting an extension from work.  Reports she had talked to HR (Rosella) who mentioned she didn't have the treatment provider work release form.  "Rosella is asking if I am sure that I am ready to return?  It seems like so many are concerned if I am ready to return."   A:  Provided pt with support.  Will inform Hillery Jacks, NP and Cyril Loosen, LCSW since pt is no longer under the services of [facility].  Pt is scheduled to return on Mon. 12-27-22.

## 2022-12-24 ENCOUNTER — Other Ambulatory Visit (HOSPITAL_COMMUNITY): Payer: BC Managed Care – PPO

## 2022-12-26 NOTE — Progress Notes (Signed)
Virtual Visit via Video Note  I connected with Ruth Taylor on 11/29/22 at  9:00 AM EDT by a video enabled telemedicine application and verified that I am speaking with the correct person using two identifiers.  Location: Patient: patient home Provider: clinical home office   I discussed the limitations of evaluation and management by telemedicine and the availability of in person appointments. The patient expressed understanding and agreed to proceed.  I discussed the assessment and treatment plan with the patient. The patient was provided an opportunity to ask questions and all were answered. The patient agreed with the plan and demonstrated an understanding of the instructions.   The patient was advised to call back or seek an in-person evaluation if the symptoms worsen or if the condition fails to improve as anticipated.  I provided 180 minutes of non-face-to-face time during this encounter.   Donia Guiles, LCSW   Daily Group Progress Note  Program: IOP  Group Time:   Participation Level: Active  Behavioral Response: Appropriate  Type of Therapy:  Group Therapy  Summary of Progress: Clinician led check-in regarding current stressors and situation, and review of patient completed daily inventory. Clinician utilized active listening and empathetic response and validated patient emotions. Clinician facilitated processing group on pertinent issues.?  Patient arrived within time allowed. Patient rates her mood at a 5 on a scale of 1-10 with 10 being best. Pt states she feels "a little overwhelmed." Pt states she slept 6 hours and ate 3x. Pt reports she spent time in nature intentionally this weekend, after identifying that outside was nourishing to her. Pt states she also had a meal with family and baked cookies. Pt reports the efforts took more energy than expected. Pt able to process. Pt engaged in discussion.   Progress Towards Goals: Initial   Group Time: 10:30 -  12:00  Participation Level:  Active  Behavioral Response: Appropriate  Type of Therapy: Group Therapy  Summary of Progress: Cln led discussion on coping with loneliness. Group members shared common reactions to loneliness and how it helps/hinders them. Cln contextualized loneliness and encouraged pt's to manage loneliness as they would any other feeling. Group brainstormed ways to manage.  Pt engaged in discussion and reports increased insight.    Progress Towards Goals: Initial   Donia Guiles, LCSW

## 2022-12-29 ENCOUNTER — Ambulatory Visit (INDEPENDENT_AMBULATORY_CARE_PROVIDER_SITE_OTHER): Payer: BC Managed Care – PPO | Admitting: Licensed Clinical Social Worker

## 2022-12-29 DIAGNOSIS — Z91199 Patient's noncompliance with other medical treatment and regimen due to unspecified reason: Secondary | ICD-10-CM

## 2022-12-29 NOTE — Progress Notes (Signed)
  THERAPIST PROGRESS NOTE   Session Date: 12/29/2022  Session Time: 1100  Participation Level: Did Not Attend   Summary: Patient no-showed today's appointment; appointment was for follow up therapy session in management of presenting MH sxs.  Diagnosis:  Encounter Diagnosis  Name Primary?   No-show for appointment Yes    Collaboration of Care: Other N/A.   [name], MSW, LCSW 12/29/2022,  11:27 AM

## 2023-01-07 ENCOUNTER — Ambulatory Visit (HOSPITAL_COMMUNITY): Payer: BC Managed Care – PPO | Admitting: Licensed Clinical Social Worker

## 2023-01-07 DIAGNOSIS — F331 Major depressive disorder, recurrent, moderate: Secondary | ICD-10-CM

## 2023-01-07 DIAGNOSIS — F411 Generalized anxiety disorder: Secondary | ICD-10-CM | POA: Diagnosis not present

## 2023-01-07 NOTE — Progress Notes (Signed)
  THERAPIST PROGRESS NOTE   Session Date: 01/07/2023  Session Time: 1008 - 1052  Participation Level: Active  Behavioral Response: Neat and Well GroomedAlertAnxious, Depressed, and Irritable Tearful  Type of Therapy: Individual Therapy  Treatment Goals addressed:  - LTG: Reduce frequency, intensity, and duration of depression symptoms so that daily functioning is improved (OP Depression) - LTG: Increase coping skills to manage depression and improve ability to perform daily activities (OP Depression) - STG: Lakitta will reduce frequency of avoidant behaviors by 50% as evidenced by self-report in therapy sessions (Anxiety) - LTG:  "I've always been positive for everybody else and I need to be positive towards myself" (OP Depression) - LTG: [name] will score less than 5 on the Generalized Anxiety Disorder 7 Scale (GAD-7) (Anxiety) - STG: Report a decrease in anxiety symptoms as evidenced by an overall reduction in anxiety score by a minimum of 25% on the Generalized Anxiety Disorder Scale (GAD-7) (Anxiety) - LTG: "Increase abilities at letting go and not feeling as I have to have control of the situations I am in" (Anxiety)  ProgressTowards Goals: Not Progressing  Interventions: Motivational Interviewing, Solution Focused, Strength-based, and Supportive  Summary: Ruth Taylor is a 42 y.o. female with past psych hx of MDD and GAD, presenting for follow up therapy session in efforts to improve the management of depressive and anxious sxs. Patient actively engaged in session, providing recounts of the past two weeks since intake apt and details surrounding transitioning back to work. Pt became tearful when detailing level of stress experienced at workplace over the past two weeks since returning from medical leave, processing the level of stress and pt's options surrounding alternate work opportunities. Pt reflected on past employment roles and responsibilities, further processing her thoughts and feelings  surrounding roles she found enjoyable and felt passionate about.  Patient responded well to interventions. Pt committed to spending time over the coming week processing thoughts and feelings surrounding current workplace stressors, the process of securing alternate employment, and the cost/benefit relationship. Patient continues to meet criteria for MDD and GAD. Patient will continue in outpatient therapy due to being the least restrictive service to meet her needs.  Suicidal/Homicidal: No  Therapist Response: Clinician utilized CBT, MI, Solution Focused and strength-based techniques to support navigating depressive and anxious sxs. Clinician engaged pt in active reflection of past week, exploring moods since returning to work, further processing stress and pt's feelings surrounding workplace stressors. Clinician utilized MI to evoke pt's perspective on opportunities for change in circumstances and what steps would be beneficial for pt to take. Encouraged pt to begin journaling to track moods, trigger/stressors, and identify things she is grateful for in order to continue to process moods and perspectives. Clinician provided support and empathy to patient during session.  Plan: Return again in 1 weeks.  Diagnosis: MDD (major depressive disorder), recurrent episode, moderate (HCC)  GAD (generalized anxiety disorder)   Collaboration of Care: Other None necessary at this time.  Patient/Guardian was advised Release of Information must be obtained prior to any record release in order to collaborate their care with an outside provider. Patient/Guardian was advised if they have not already done so to contact the registration department to sign all necessary forms in order for [name] to release information regarding their care.   Consent: Patient/Guardian gives verbal consent for treatment and assignment of benefits for services provided during this visit. Patient/Guardian expressed understanding and agreed to  proceed.   Ruth Taylor 01/07/2023,  10:21 AM

## 2023-01-13 ENCOUNTER — Ambulatory Visit (INDEPENDENT_AMBULATORY_CARE_PROVIDER_SITE_OTHER): Payer: BC Managed Care – PPO | Admitting: Licensed Clinical Social Worker

## 2023-01-13 DIAGNOSIS — Z91199 Patient's noncompliance with other medical treatment and regimen due to unspecified reason: Secondary | ICD-10-CM

## 2023-01-13 NOTE — Progress Notes (Signed)
THERAPIST PROGRESS NOTE   Session Date: 01/13/2023  Session Time: 1500  Participation Level: Did Not Attend   Summary: Patient no-showed today's appointment; appointment was for follow up therapy session in management of presenting MH sxs.  [name], MSW, LCSW 01/13/2023,  3:19 PM

## 2023-01-19 ENCOUNTER — Ambulatory Visit (INDEPENDENT_AMBULATORY_CARE_PROVIDER_SITE_OTHER): Payer: BC Managed Care – PPO | Admitting: Licensed Clinical Social Worker

## 2023-01-19 ENCOUNTER — Encounter (HOSPITAL_COMMUNITY): Payer: Self-pay | Admitting: Licensed Clinical Social Worker

## 2023-01-19 DIAGNOSIS — Z91199 Patient's noncompliance with other medical treatment and regimen due to unspecified reason: Secondary | ICD-10-CM

## 2023-01-19 NOTE — Progress Notes (Signed)
  THERAPIST PROGRESS NOTE   Session Date: 01/19/2023  Session Time: 1500  Participation Level: Did Not Attend   Summary: Patient no-showed today's appointment; appointment was for follow up therapy session in management of presenting MH sxs. Pt no-showed apt's on 12/29/22, 01/13/23 and today. Per [facility] policy, pt will be discharged from practice due to 3 no-show apt's.  Diagnosis:  Encounter Diagnosis  Name Primary?   No-show for appointment Yes    Collaboration of Care: Other N/A.   [name], MSW, LCSW 01/19/2023,  3:33 PM

## 2023-01-24 ENCOUNTER — Telehealth (HOSPITAL_COMMUNITY): Payer: Self-pay | Admitting: Licensed Clinical Social Worker

## 2023-01-24 ENCOUNTER — Encounter (HOSPITAL_COMMUNITY): Payer: Self-pay | Admitting: Licensed Clinical Social Worker

## 2023-01-24 NOTE — Telephone Encounter (Signed)
01/24/23 Due to non-compliance in keeping appts on 11/6, 11/21/ and 11/27.  A letter was sent for discharge from therapist - certified mail [phone].Marland KitchenMarguerite Olea

## 2023-01-26 ENCOUNTER — Ambulatory Visit (HOSPITAL_COMMUNITY): Payer: BC Managed Care – PPO | Admitting: Licensed Clinical Social Worker

## 2023-02-02 ENCOUNTER — Ambulatory Visit (HOSPITAL_COMMUNITY): Payer: BC Managed Care – PPO | Admitting: Licensed Clinical Social Worker

## 2023-02-10 ENCOUNTER — Encounter (HOSPITAL_COMMUNITY): Payer: Self-pay

## 2023-02-10 ENCOUNTER — Ambulatory Visit (HOSPITAL_COMMUNITY): Payer: BC Managed Care – PPO | Admitting: Licensed Clinical Social Worker

## 2023-02-14 ENCOUNTER — Ambulatory Visit (HOSPITAL_COMMUNITY): Payer: BC Managed Care – PPO | Admitting: Licensed Clinical Social Worker

## 2023-03-23 ENCOUNTER — Ambulatory Visit (HOSPITAL_COMMUNITY): Payer: BC Managed Care – PPO | Admitting: Family

## 2023-04-11 NOTE — Progress Notes (Unsigned)
New Patient Office Visit  Subjective    Patient ID: Ruth Taylor, female    DOB: [birth year]  Age: 43 y.o. MRN: [id number]  CC: No chief complaint on file.   HPI Ruth Taylor presents to establish care ***  Outpatient Encounter Medications as of 04/12/2023  Medication Sig  . FLUoxetine (PROZAC) 40 MG capsule Take 1 capsule (40 mg total) by mouth daily.  . hydrOXYzine (ATARAX) 10 MG tablet Take 1 tablet (10 mg total) by mouth 2 (two) times daily as needed.  . hydrOXYzine (ATARAX) 25 MG tablet Take 1 tablet (25 mg total) by mouth at bedtime as needed (insomnia).   No facility-administered encounter medications on file as of 04/12/2023.    Past Medical History:  Diagnosis Date  . Anxiety   . Depression   . Graves disease   . Graves' disease   . Hx of heart failure    "associated with autoimmune"  . Hyperthyroidism   . PTSD (post-traumatic stress disorder)     Past Surgical History:  Procedure Laterality Date  . NASAL SINUS SURGERY    . TONSILLECTOMY      Family History  Problem Relation Age of Onset  . Stroke Father        quadriplegic, brain stem stroke  . Heart Problems Paternal Uncle   . Breast cancer Maternal Grandmother   . Heart Problems Paternal Grandmother   . Prostate cancer Paternal Grandfather   . Heart Problems Other        mother's side  . Thyroid disease Other        mother's side   . Stroke Other        mother's side   . Cancer Other        mother's side   . Neuromuscular disorder Neg Hx   . Cancer - Colon Neg Hx     Social History   Socioeconomic History  . Marital status: Single    Spouse name: Not on file  . Number of children: 2  . Years of education: Not on file  . Highest education level: Not on file  Occupational History  . Not on file  Tobacco Use  . Smoking status: Former    Types: Cigarettes  . Smokeless tobacco: Never  Vaping Use  . Vaping status: Never Used  Substance and Sexual Activity  . Alcohol use: Not Currently   . Drug use: Not Currently    Comment: marijuana in past  . Sexual activity: Yes    Comment: will be starting [employer] [facility] [employer] soon  Other Topics Concern  . Not on file  Social History Narrative   Lives alone, children are there sometimes    Right handed   Caffeine: 1-2 cups/day   Social Drivers of Corporate investment banker Strain: Not on file  Food Insecurity: Not on file  Transportation Needs: Not on file  Physical Activity: Not on file  Stress: Not on file  Social Connections: Not on file  Intimate Partner Violence: Not on file    ROS Per HPI      Objective    There were no vitals taken for this visit.  Physical Exam Vitals and nursing note reviewed.  Constitutional:      Appearance: Normal appearance. She is normal weight.  HENT:     Head: Normocephalic and atraumatic.     Right Ear: Tympanic membrane and ear canal normal.     Left Ear: Tympanic membrane and ear canal normal.  Nose: Nose normal.  Eyes:     Extraocular Movements: Extraocular movements intact.     Pupils: Pupils are equal, round, and reactive to light.  Cardiovascular:     Rate and Rhythm: Normal rate and regular rhythm.     Heart sounds: Normal heart sounds.  Pulmonary:     Effort: Pulmonary effort is normal.     Breath sounds: Normal breath sounds.  Musculoskeletal:        General: Normal range of motion.     Cervical back: Normal range of motion.  Neurological:     General: No focal deficit present.     Mental Status: She is alert and oriented to person, place, and time.  Psychiatric:        Mood and Affect: Mood normal.        Thought Content: Thought content normal.       Assessment & Plan:   Hyperthyroidism  Graves' disease  Iron deficiency  Low serum vitamin B12  MDD (major depressive disorder), recurrent episode, moderate (HCC)  GAD (generalized anxiety disorder)     No follow-ups on file.   Moshe Cipro, FNP

## 2023-04-12 ENCOUNTER — Ambulatory Visit: Payer: BC Managed Care – PPO | Admitting: Family Medicine

## 2023-04-12 ENCOUNTER — Encounter: Payer: Self-pay | Admitting: Family Medicine

## 2023-04-12 VITALS — BP 112/70 | HR 82 | Temp 98.2°F | Ht 63.0 in | Wt 132.2 lb

## 2023-04-12 DIAGNOSIS — E611 Iron deficiency: Secondary | ICD-10-CM | POA: Diagnosis not present

## 2023-04-12 DIAGNOSIS — E538 Deficiency of other specified B group vitamins: Secondary | ICD-10-CM

## 2023-04-12 DIAGNOSIS — E7800 Pure hypercholesterolemia, unspecified: Secondary | ICD-10-CM

## 2023-04-12 DIAGNOSIS — G5602 Carpal tunnel syndrome, left upper limb: Secondary | ICD-10-CM

## 2023-04-12 DIAGNOSIS — F331 Major depressive disorder, recurrent, moderate: Secondary | ICD-10-CM | POA: Diagnosis not present

## 2023-04-12 DIAGNOSIS — E0500 Thyrotoxicosis with diffuse goiter without thyrotoxic crisis or storm: Secondary | ICD-10-CM | POA: Diagnosis not present

## 2023-04-12 DIAGNOSIS — Z23 Encounter for immunization: Secondary | ICD-10-CM | POA: Diagnosis not present

## 2023-04-12 DIAGNOSIS — E0590 Thyrotoxicosis, unspecified without thyrotoxic crisis or storm: Secondary | ICD-10-CM | POA: Diagnosis not present

## 2023-04-12 DIAGNOSIS — F411 Generalized anxiety disorder: Secondary | ICD-10-CM

## 2023-04-12 DIAGNOSIS — G43809 Other migraine, not intractable, without status migrainosus: Secondary | ICD-10-CM

## 2023-04-12 LAB — CBC WITH DIFFERENTIAL/PLATELET
Basophils Absolute: 0.1 10*3/uL (ref 0.0–0.1)
Basophils Relative: 1 % (ref 0.0–3.0)
Eosinophils Absolute: 0.3 10*3/uL (ref 0.0–0.7)
Eosinophils Relative: 3.6 % (ref 0.0–5.0)
HCT: 42 % (ref 36.0–46.0)
Hemoglobin: 13.8 g/dL (ref 12.0–15.0)
Lymphocytes Relative: 40.6 % (ref 12.0–46.0)
Lymphs Abs: 3.1 10*3/uL (ref 0.7–4.0)
MCHC: 32.8 g/dL (ref 30.0–36.0)
MCV: 82.4 fL (ref 78.0–100.0)
Monocytes Absolute: 0.5 10*3/uL (ref 0.1–1.0)
Monocytes Relative: 5.9 % (ref 3.0–12.0)
Neutro Abs: 3.7 10*3/uL (ref 1.4–7.7)
Neutrophils Relative %: 48.9 % (ref 43.0–77.0)
Platelets: 336 10*3/uL (ref 150.0–400.0)
RBC: 5.1 Mil/uL (ref 3.87–5.11)
RDW: 14.8 % (ref 11.5–15.5)
WBC: 7.7 10*3/uL (ref 4.0–10.5)

## 2023-04-12 LAB — POCT URINALYSIS DIPSTICK
Bilirubin, UA: NEGATIVE
Blood, UA: POSITIVE
Glucose, UA: NEGATIVE
Ketones, UA: NEGATIVE
Leukocytes, UA: NEGATIVE
Nitrite, UA: NEGATIVE
Protein, UA: POSITIVE — AB
Spec Grav, UA: >=1.03 — AB (ref 1.010–1.025)
Urobilinogen, UA: 0.2 U/dL
pH, UA: 6 (ref 5.0–8.0)

## 2023-04-12 LAB — COMPREHENSIVE METABOLIC PANEL
ALT: 12 U/L (ref 0–35)
AST: 15 U/L (ref 0–37)
Albumin: 4.5 g/dL (ref 3.5–5.2)
Alkaline Phosphatase: 98 U/L (ref 39–117)
BUN: 12 mg/dL (ref 6–23)
CO2: 29 meq/L (ref 19–32)
Calcium: 9.1 mg/dL (ref 8.4–10.5)
Chloride: 101 meq/L (ref 96–112)
Creatinine, Ser: 0.72 mg/dL (ref 0.40–1.20)
GFR: 102.04 mL/min (ref >60.00)
Glucose, Bld: 93 mg/dL (ref 70–99)
Potassium: 3.9 meq/L (ref 3.5–5.1)
Sodium: 138 meq/L (ref 135–145)
Total Bilirubin: 0.3 mg/dL (ref 0.2–1.2)
Total Protein: 7.1 g/dL (ref 6.0–8.3)

## 2023-04-12 LAB — VITAMIN D 25 HYDROXY (VIT D DEFICIENCY, FRACTURES): VITD: 13.32 ng/mL — ABNORMAL LOW (ref 30.00–100.00)

## 2023-04-12 LAB — VITAMIN B12: Vitamin B-12: 257 pg/mL (ref 211–911)

## 2023-04-12 LAB — LIPID PANEL
Cholesterol: 219 mg/dL — ABNORMAL HIGH (ref 0–200)
HDL: 46.9 mg/dL (ref >39.00)
LDL Cholesterol: 126 mg/dL — ABNORMAL HIGH (ref 0–99)
NonHDL: 172.54
Total CHOL/HDL Ratio: 5
Triglycerides: 235 mg/dL — ABNORMAL HIGH (ref 0.0–149.0)
VLDL: 47 mg/dL — ABNORMAL HIGH (ref 0.0–40.0)

## 2023-04-12 LAB — TSH: TSH: 0.02 u[IU]/mL — ABNORMAL LOW (ref 0.35–5.50)

## 2023-04-12 NOTE — Patient Instructions (Addendum)
Welcome to [facility]!  We are checking labs today, will be in contact with any results that require further attention.  Continue current medication regimen.   Recommend sleeping in wrist braces to help keep wrists in neutral position while you sleep.  Red yeast rice and Co-Q 10.   Follow-up with me for new or worsening symptoms.

## 2023-04-13 LAB — IRON,TIBC AND FERRITIN PANEL
%SAT: 8 % — ABNORMAL LOW (ref 16–45)
Ferritin: 17 ng/mL (ref 16–232)
Iron: 34 ug/dL — ABNORMAL LOW (ref 40–190)
TIBC: 428 ug/dL (ref 250–450)

## 2023-04-14 ENCOUNTER — Telehealth: Payer: Self-pay

## 2023-04-14 ENCOUNTER — Telehealth: Payer: Self-pay | Admitting: Family Medicine

## 2023-04-14 NOTE — Telephone Encounter (Signed)
Copied from CRM [id number]. Topic: Clinical - Lab/Test Results >> Apr 14, 2023  3:32 PM Gurney Maxin H wrote: Reason for CRM: Patient is calling to obtain lab results, please reach out to patient, thanks.  [place] [phone]

## 2023-04-14 NOTE — Telephone Encounter (Signed)
 Copied from CRM [id number]. Topic: Clinical - Lab/Test Results >> Apr 14, 2023  3:32 PM Gurney Maxin H wrote: Reason for CRM: Patient is calling to obtain lab results, please reach out to patient, thanks.  [place] [phone]

## 2023-04-14 NOTE — Telephone Encounter (Signed)
Spoke with patient, she is aware provider is out of office and will return tomorrow.   Patient mentions she talked to her supervisor about possibly needing FMLA to come to her doctors appointments on 04/12/2023, supervisor made it seem like they were very supportive of this. Patient presented to work today, and was fired. I let patient know we may be able to place a social work referral, to help her possibly obtain medicaid and other resources needed to stay afloat throughout this process. Will make PCP aware.

## 2023-04-15 MED ORDER — METHIMAZOLE 5 MG PO TABS: 5.0000 mg | ORAL_TABLET | Freq: Three times a day (TID) | ORAL | 2 refills | Status: DC

## 2023-04-15 NOTE — Addendum Note (Signed)
Addended by: Sherald Barge on: 04/15/2023 09:54 AM   Modules accepted: Orders

## 2023-04-19 NOTE — Progress Notes (Signed)
 Spoke with patient, is scheduled for first b12 injection

## 2023-04-20 ENCOUNTER — Ambulatory Visit (INDEPENDENT_AMBULATORY_CARE_PROVIDER_SITE_OTHER): Payer: Self-pay

## 2023-04-20 DIAGNOSIS — E538 Deficiency of other specified B group vitamins: Secondary | ICD-10-CM

## 2023-04-20 MED ORDER — CYANOCOBALAMIN 1000 MCG/ML IJ SOLN
1000.0000 ug | Freq: Once | INTRAMUSCULAR | Status: AC
  Administered 2023-04-20: 1000 ug via INTRAMUSCULAR

## 2023-04-20 NOTE — Progress Notes (Signed)
 Patient visits today to receive her B12 injection/vaccine. Patient was informed and tolerated well. Patient was notified to reach out to [name] if needed.

## 2023-06-08 ENCOUNTER — Ambulatory Visit: Payer: BC Managed Care – PPO | Admitting: Family Medicine

## 2023-06-10 ENCOUNTER — Other Ambulatory Visit (HOSPITAL_COMMUNITY): Payer: Self-pay | Admitting: Family

## 2023-08-14 ENCOUNTER — Other Ambulatory Visit: Payer: Self-pay | Admitting: Family Medicine

## 2023-08-14 DIAGNOSIS — E0500 Thyrotoxicosis with diffuse goiter without thyrotoxic crisis or storm: Secondary | ICD-10-CM

## 2023-11-17 DIAGNOSIS — Z131 Encounter for screening for diabetes mellitus: Secondary | ICD-10-CM | POA: Diagnosis not present

## 2023-11-17 DIAGNOSIS — Z13220 Encounter for screening for lipoid disorders: Secondary | ICD-10-CM | POA: Diagnosis not present

## 2023-11-17 DIAGNOSIS — Z1159 Encounter for screening for other viral diseases: Secondary | ICD-10-CM | POA: Diagnosis not present

## 2023-11-17 DIAGNOSIS — Z139 Encounter for screening, unspecified: Secondary | ICD-10-CM | POA: Diagnosis not present

## 2023-11-17 DIAGNOSIS — F4321 Adjustment disorder with depressed mood: Secondary | ICD-10-CM | POA: Diagnosis not present

## 2023-11-17 DIAGNOSIS — Z1389 Encounter for screening for other disorder: Secondary | ICD-10-CM | POA: Diagnosis not present

## 2023-11-17 DIAGNOSIS — D649 Anemia, unspecified: Secondary | ICD-10-CM | POA: Diagnosis not present

## 2023-11-17 DIAGNOSIS — E0500 Thyrotoxicosis with diffuse goiter without thyrotoxic crisis or storm: Secondary | ICD-10-CM | POA: Diagnosis not present

## 2023-11-18 DIAGNOSIS — D649 Anemia, unspecified: Secondary | ICD-10-CM | POA: Diagnosis not present

## 2023-11-18 DIAGNOSIS — Z1159 Encounter for screening for other viral diseases: Secondary | ICD-10-CM | POA: Diagnosis not present

## 2023-11-18 DIAGNOSIS — E0500 Thyrotoxicosis with diffuse goiter without thyrotoxic crisis or storm: Secondary | ICD-10-CM | POA: Diagnosis not present

## 2023-11-18 DIAGNOSIS — Z131 Encounter for screening for diabetes mellitus: Secondary | ICD-10-CM | POA: Diagnosis not present

## 2023-11-18 DIAGNOSIS — Z13220 Encounter for screening for lipoid disorders: Secondary | ICD-10-CM | POA: Diagnosis not present

## 2023-11-18 DIAGNOSIS — Z139 Encounter for screening, unspecified: Secondary | ICD-10-CM | POA: Diagnosis not present

## 2023-12-14 DIAGNOSIS — E0500 Thyrotoxicosis with diffuse goiter without thyrotoxic crisis or storm: Secondary | ICD-10-CM | POA: Diagnosis not present

## 2023-12-14 DIAGNOSIS — Z1331 Encounter for screening for depression: Secondary | ICD-10-CM | POA: Diagnosis not present

## 2023-12-14 DIAGNOSIS — Z113 Encounter for screening for infections with a predominantly sexual mode of transmission: Secondary | ICD-10-CM | POA: Diagnosis not present

## 2023-12-14 DIAGNOSIS — Z139 Encounter for screening, unspecified: Secondary | ICD-10-CM | POA: Diagnosis not present

## 2023-12-14 DIAGNOSIS — Z1389 Encounter for screening for other disorder: Secondary | ICD-10-CM | POA: Diagnosis not present

## 2023-12-14 DIAGNOSIS — F4321 Adjustment disorder with depressed mood: Secondary | ICD-10-CM | POA: Diagnosis not present

## 2024-02-07 DIAGNOSIS — E785 Hyperlipidemia, unspecified: Secondary | ICD-10-CM | POA: Diagnosis not present

## 2024-02-07 DIAGNOSIS — D509 Iron deficiency anemia, unspecified: Secondary | ICD-10-CM | POA: Diagnosis not present

## 2024-02-07 DIAGNOSIS — E0590 Thyrotoxicosis, unspecified without thyrotoxic crisis or storm: Secondary | ICD-10-CM | POA: Diagnosis not present

## 2024-02-07 DIAGNOSIS — I5032 Chronic diastolic (congestive) heart failure: Secondary | ICD-10-CM | POA: Diagnosis not present

## 2024-02-07 DIAGNOSIS — R002 Palpitations: Secondary | ICD-10-CM | POA: Diagnosis not present

## 2024-02-07 DIAGNOSIS — F339 Major depressive disorder, recurrent, unspecified: Secondary | ICD-10-CM | POA: Diagnosis not present

## 2024-02-07 DIAGNOSIS — E559 Vitamin D deficiency, unspecified: Secondary | ICD-10-CM | POA: Diagnosis not present
